# Patient Record
Sex: Female | Born: 1975 | Race: White | Hispanic: No | Marital: Married | Smoking: Former smoker
Health system: Southern US, Community
[De-identification: ages and names within clinical notes are randomized; demographics above are authoritative.]

## PROBLEM LIST (undated history)

## (undated) DIAGNOSIS — I10 Essential (primary) hypertension: Secondary | ICD-10-CM

## (undated) DIAGNOSIS — D37030 Neoplasm of uncertain behavior of the parotid salivary glands: Secondary | ICD-10-CM

---

## 2006-02-23 ENCOUNTER — Emergency Department: Payer: Self-pay | Admitting: Emergency Medicine

## 2007-01-15 ENCOUNTER — Emergency Department: Payer: Self-pay | Admitting: Emergency Medicine

## 2008-11-09 ENCOUNTER — Emergency Department: Payer: Self-pay

## 2011-01-18 ENCOUNTER — Ambulatory Visit: Payer: Self-pay | Admitting: Internal Medicine

## 2014-02-12 DIAGNOSIS — M549 Dorsalgia, unspecified: Secondary | ICD-10-CM | POA: Insufficient documentation

## 2014-02-12 DIAGNOSIS — M5416 Radiculopathy, lumbar region: Secondary | ICD-10-CM | POA: Insufficient documentation

## 2014-02-12 DIAGNOSIS — M545 Low back pain, unspecified: Secondary | ICD-10-CM | POA: Insufficient documentation

## 2014-02-12 HISTORY — DX: Radiculopathy, lumbar region: M54.16

## 2015-06-22 ENCOUNTER — Other Ambulatory Visit: Payer: Self-pay | Admitting: Internal Medicine

## 2015-06-22 DIAGNOSIS — R109 Unspecified abdominal pain: Secondary | ICD-10-CM

## 2015-06-24 ENCOUNTER — Ambulatory Visit
Admission: RE | Admit: 2015-06-24 | Discharge: 2015-06-24 | Disposition: A | Discharge status: Home / Self Care | Payer: BLUE CROSS/BLUE SHIELD | Source: Ambulatory Visit | Attending: Internal Medicine | Admitting: Internal Medicine

## 2015-06-24 DIAGNOSIS — K760 Fatty (change of) liver, not elsewhere classified: Secondary | ICD-10-CM | POA: Insufficient documentation

## 2015-06-24 DIAGNOSIS — R109 Unspecified abdominal pain: Secondary | ICD-10-CM | POA: Diagnosis not present

## 2016-01-17 ENCOUNTER — Other Ambulatory Visit: Payer: Self-pay | Admitting: Internal Medicine

## 2016-01-17 DIAGNOSIS — Z1231 Encounter for screening mammogram for malignant neoplasm of breast: Secondary | ICD-10-CM

## 2016-01-26 ENCOUNTER — Ambulatory Visit: Payer: BLUE CROSS/BLUE SHIELD

## 2016-02-23 ENCOUNTER — Other Ambulatory Visit: Payer: Self-pay | Admitting: Internal Medicine

## 2016-02-23 ENCOUNTER — Ambulatory Visit
Admission: RE | Admit: 2016-02-23 | Discharge: 2016-02-23 | Disposition: A | Discharge status: Home / Self Care | Payer: BLUE CROSS/BLUE SHIELD | Source: Ambulatory Visit | Attending: Internal Medicine | Admitting: Internal Medicine

## 2016-02-23 DIAGNOSIS — Z1231 Encounter for screening mammogram for malignant neoplasm of breast: Secondary | ICD-10-CM | POA: Diagnosis present

## 2016-02-24 ENCOUNTER — Other Ambulatory Visit: Payer: Self-pay | Admitting: Internal Medicine

## 2016-02-24 DIAGNOSIS — R928 Other abnormal and inconclusive findings on diagnostic imaging of breast: Secondary | ICD-10-CM

## 2016-03-10 ENCOUNTER — Ambulatory Visit
Admission: RE | Admit: 2016-03-10 | Discharge: 2016-03-10 | Disposition: A | Discharge status: Home / Self Care | Payer: BLUE CROSS/BLUE SHIELD | Source: Ambulatory Visit | Attending: Internal Medicine | Admitting: Internal Medicine

## 2016-03-10 DIAGNOSIS — R928 Other abnormal and inconclusive findings on diagnostic imaging of breast: Secondary | ICD-10-CM | POA: Diagnosis present

## 2017-01-16 ENCOUNTER — Other Ambulatory Visit: Payer: Self-pay | Admitting: Internal Medicine

## 2017-01-16 DIAGNOSIS — Z1231 Encounter for screening mammogram for malignant neoplasm of breast: Secondary | ICD-10-CM

## 2017-02-26 ENCOUNTER — Ambulatory Visit
Admission: RE | Admit: 2017-02-26 | Discharge: 2017-02-26 | Disposition: A | Discharge status: Home / Self Care | Payer: BLUE CROSS/BLUE SHIELD | Source: Ambulatory Visit | Attending: Internal Medicine | Admitting: Internal Medicine

## 2017-02-26 DIAGNOSIS — Z1231 Encounter for screening mammogram for malignant neoplasm of breast: Secondary | ICD-10-CM | POA: Insufficient documentation

## 2018-01-17 ENCOUNTER — Other Ambulatory Visit: Payer: Self-pay | Admitting: Internal Medicine

## 2018-01-17 DIAGNOSIS — Z1231 Encounter for screening mammogram for malignant neoplasm of breast: Secondary | ICD-10-CM

## 2018-02-27 ENCOUNTER — Ambulatory Visit
Admission: RE | Admit: 2018-02-27 | Discharge: 2018-02-27 | Disposition: A | Discharge status: Home / Self Care | Payer: BLUE CROSS/BLUE SHIELD | Source: Ambulatory Visit | Attending: Internal Medicine | Admitting: Internal Medicine

## 2018-02-27 DIAGNOSIS — Z1231 Encounter for screening mammogram for malignant neoplasm of breast: Secondary | ICD-10-CM | POA: Diagnosis not present

## 2019-05-02 ENCOUNTER — Other Ambulatory Visit: Payer: Self-pay | Admitting: Family

## 2019-05-02 ENCOUNTER — Other Ambulatory Visit: Payer: Self-pay | Admitting: Internal Medicine

## 2019-05-02 DIAGNOSIS — Z1231 Encounter for screening mammogram for malignant neoplasm of breast: Secondary | ICD-10-CM

## 2019-05-14 ENCOUNTER — Ambulatory Visit
Admission: RE | Admit: 2019-05-14 | Discharge: 2019-05-14 | Disposition: A | Discharge status: Home / Self Care | Payer: BC Managed Care – PPO | Source: Ambulatory Visit | Attending: Family | Admitting: Family

## 2019-05-14 DIAGNOSIS — Z1231 Encounter for screening mammogram for malignant neoplasm of breast: Secondary | ICD-10-CM | POA: Insufficient documentation

## 2019-06-26 DIAGNOSIS — E1165 Type 2 diabetes mellitus with hyperglycemia: Secondary | ICD-10-CM | POA: Insufficient documentation

## 2019-06-26 DIAGNOSIS — E119 Type 2 diabetes mellitus without complications: Secondary | ICD-10-CM

## 2019-06-26 DIAGNOSIS — E039 Hypothyroidism, unspecified: Secondary | ICD-10-CM

## 2019-06-26 HISTORY — DX: Type 2 diabetes mellitus without complications: E11.9

## 2019-06-26 HISTORY — DX: Hypothyroidism, unspecified: E03.9

## 2020-02-18 DIAGNOSIS — R5382 Chronic fatigue, unspecified: Secondary | ICD-10-CM

## 2020-02-18 DIAGNOSIS — R768 Other specified abnormal immunological findings in serum: Secondary | ICD-10-CM | POA: Insufficient documentation

## 2020-02-18 DIAGNOSIS — R6889 Other general symptoms and signs: Secondary | ICD-10-CM | POA: Insufficient documentation

## 2020-02-18 DIAGNOSIS — M797 Fibromyalgia: Secondary | ICD-10-CM | POA: Insufficient documentation

## 2020-02-18 DIAGNOSIS — Z79891 Long term (current) use of opiate analgesic: Secondary | ICD-10-CM | POA: Insufficient documentation

## 2020-02-18 HISTORY — DX: Chronic fatigue, unspecified: R53.82

## 2020-02-18 HISTORY — DX: Fibromyalgia: M79.7

## 2020-04-28 ENCOUNTER — Other Ambulatory Visit: Payer: Self-pay | Admitting: Family

## 2020-04-28 DIAGNOSIS — Z1231 Encounter for screening mammogram for malignant neoplasm of breast: Secondary | ICD-10-CM

## 2020-05-17 ENCOUNTER — Other Ambulatory Visit: Payer: Self-pay

## 2020-05-17 ENCOUNTER — Ambulatory Visit
Admission: RE | Admit: 2020-05-17 | Discharge: 2020-05-17 | Disposition: A | Discharge status: Home / Self Care | Payer: BC Managed Care – PPO | Source: Ambulatory Visit | Attending: Family | Admitting: Family

## 2020-05-17 DIAGNOSIS — Z1231 Encounter for screening mammogram for malignant neoplasm of breast: Secondary | ICD-10-CM

## 2020-07-22 ENCOUNTER — Other Ambulatory Visit: Payer: Self-pay | Admitting: Neurology

## 2020-07-22 DIAGNOSIS — R4189 Other symptoms and signs involving cognitive functions and awareness: Secondary | ICD-10-CM

## 2020-07-23 ENCOUNTER — Other Ambulatory Visit (HOSPITAL_COMMUNITY): Payer: Self-pay | Admitting: Neurology

## 2020-07-23 DIAGNOSIS — R4189 Other symptoms and signs involving cognitive functions and awareness: Secondary | ICD-10-CM

## 2020-08-02 ENCOUNTER — Other Ambulatory Visit: Payer: Self-pay

## 2020-08-02 ENCOUNTER — Ambulatory Visit (HOSPITAL_COMMUNITY)
Admission: RE | Admit: 2020-08-02 | Discharge: 2020-08-02 | Disposition: A | Discharge status: Home / Self Care | Payer: BC Managed Care – PPO | Source: Ambulatory Visit | Attending: Neurology | Admitting: Neurology

## 2020-08-02 DIAGNOSIS — R4189 Other symptoms and signs involving cognitive functions and awareness: Secondary | ICD-10-CM | POA: Insufficient documentation

## 2020-08-02 MED ORDER — GADOBUTROL 1 MMOL/ML IV SOLN
9.5000 mL | Freq: Once | INTRAVENOUS | Status: AC | PRN
Start: 2020-08-02 — End: 2020-08-02
  Administered 2020-08-02: 9.5 mL via INTRAVENOUS

## 2021-04-26 ENCOUNTER — Other Ambulatory Visit: Payer: Self-pay | Admitting: Family

## 2021-04-26 DIAGNOSIS — Z1231 Encounter for screening mammogram for malignant neoplasm of breast: Secondary | ICD-10-CM

## 2021-05-18 ENCOUNTER — Ambulatory Visit
Admission: RE | Admit: 2021-05-18 | Discharge: 2021-05-18 | Disposition: A | Discharge status: Home / Self Care | Payer: BC Managed Care – PPO | Source: Ambulatory Visit | Attending: Family | Admitting: Family

## 2021-05-18 ENCOUNTER — Other Ambulatory Visit: Payer: Self-pay

## 2021-05-18 DIAGNOSIS — Z1231 Encounter for screening mammogram for malignant neoplasm of breast: Secondary | ICD-10-CM | POA: Insufficient documentation

## 2022-04-11 ENCOUNTER — Ambulatory Visit
Admission: RE | Admit: 2022-04-11 | Discharge: 2022-04-11 | Disposition: A | Discharge status: Home / Self Care | Payer: Self-pay | Source: Ambulatory Visit | Attending: Family | Admitting: Family

## 2022-04-11 ENCOUNTER — Ambulatory Visit
Admission: RE | Admit: 2022-04-11 | Discharge: 2022-04-11 | Disposition: A | Discharge status: Home / Self Care | Payer: Self-pay | Attending: Family | Admitting: Family

## 2022-04-11 ENCOUNTER — Other Ambulatory Visit: Payer: Self-pay | Admitting: Family

## 2022-04-11 ENCOUNTER — Other Ambulatory Visit: Payer: Self-pay | Admitting: Nurse Practitioner

## 2022-04-11 DIAGNOSIS — W228XXA Striking against or struck by other objects, initial encounter: Secondary | ICD-10-CM | POA: Insufficient documentation

## 2022-04-11 DIAGNOSIS — R52 Pain, unspecified: Secondary | ICD-10-CM

## 2022-04-11 DIAGNOSIS — M1811 Unilateral primary osteoarthritis of first carpometacarpal joint, right hand: Secondary | ICD-10-CM | POA: Insufficient documentation

## 2022-04-20 ENCOUNTER — Other Ambulatory Visit: Payer: Self-pay | Admitting: Family

## 2022-05-10 ENCOUNTER — Other Ambulatory Visit: Payer: Self-pay | Admitting: Family

## 2022-05-10 DIAGNOSIS — N6310 Unspecified lump in the right breast, unspecified quadrant: Secondary | ICD-10-CM

## 2022-05-26 ENCOUNTER — Ambulatory Visit
Admission: RE | Admit: 2022-05-26 | Discharge: 2022-05-26 | Disposition: A | Discharge status: Home / Self Care | Payer: 59 | Source: Ambulatory Visit | Attending: Family | Admitting: Family

## 2022-05-26 DIAGNOSIS — N6310 Unspecified lump in the right breast, unspecified quadrant: Secondary | ICD-10-CM | POA: Diagnosis present

## 2022-06-20 DIAGNOSIS — E119 Type 2 diabetes mellitus without complications: Secondary | ICD-10-CM | POA: Diagnosis not present

## 2022-06-20 DIAGNOSIS — E039 Hypothyroidism, unspecified: Secondary | ICD-10-CM | POA: Diagnosis not present

## 2022-06-20 DIAGNOSIS — E785 Hyperlipidemia, unspecified: Secondary | ICD-10-CM | POA: Diagnosis not present

## 2022-06-20 DIAGNOSIS — E1169 Type 2 diabetes mellitus with other specified complication: Secondary | ICD-10-CM | POA: Diagnosis not present

## 2022-08-02 DIAGNOSIS — E611 Iron deficiency: Secondary | ICD-10-CM | POA: Diagnosis not present

## 2022-08-02 DIAGNOSIS — E782 Mixed hyperlipidemia: Secondary | ICD-10-CM | POA: Diagnosis not present

## 2022-08-02 DIAGNOSIS — E039 Hypothyroidism, unspecified: Secondary | ICD-10-CM | POA: Diagnosis not present

## 2022-08-02 DIAGNOSIS — J449 Chronic obstructive pulmonary disease, unspecified: Secondary | ICD-10-CM | POA: Diagnosis not present

## 2022-08-02 DIAGNOSIS — D519 Vitamin B12 deficiency anemia, unspecified: Secondary | ICD-10-CM | POA: Diagnosis not present

## 2022-08-02 DIAGNOSIS — I10 Essential (primary) hypertension: Secondary | ICD-10-CM | POA: Diagnosis not present

## 2022-08-02 DIAGNOSIS — E559 Vitamin D deficiency, unspecified: Secondary | ICD-10-CM | POA: Diagnosis not present

## 2022-08-02 DIAGNOSIS — E119 Type 2 diabetes mellitus without complications: Secondary | ICD-10-CM | POA: Diagnosis not present

## 2022-08-02 DIAGNOSIS — R5383 Other fatigue: Secondary | ICD-10-CM | POA: Diagnosis not present

## 2022-08-02 DIAGNOSIS — M79673 Pain in unspecified foot: Secondary | ICD-10-CM | POA: Diagnosis not present

## 2022-08-02 DIAGNOSIS — M722 Plantar fascial fibromatosis: Secondary | ICD-10-CM | POA: Diagnosis not present

## 2022-08-02 DIAGNOSIS — M797 Fibromyalgia: Secondary | ICD-10-CM | POA: Diagnosis not present

## 2022-08-02 DIAGNOSIS — L723 Sebaceous cyst: Secondary | ICD-10-CM | POA: Diagnosis not present

## 2022-08-17 ENCOUNTER — Ambulatory Visit: Payer: 59

## 2022-08-17 ENCOUNTER — Ambulatory Visit: Payer: 59 | Admitting: Podiatry

## 2022-08-17 DIAGNOSIS — M722 Plantar fascial fibromatosis: Secondary | ICD-10-CM

## 2022-08-22 ENCOUNTER — Encounter: Payer: Self-pay | Admitting: Podiatry

## 2022-08-22 NOTE — Progress Notes (Signed)
Subjective:  Patient ID: Kiara Woodard, female    DOB: [birth year],  MRN: [id number]  Chief Complaint  Patient presents with   Plantar Fasciitis    Bilateral heel pain right foot is worse than left     46 y.o. female presents with the above complaint.  Patient presents with bilateral heel pain right is worse than left side.  Hurts with ambulation worse with pressure.  She states that has been going for quite some time.  Pain scale is 7 out of 10 sharp shooting in nature hurts with taking for step in the morning.  Denies seeing anyone else prior to seeing me for this.   Review of Systems: Negative except as noted in the HPI. Denies N/V/F/Ch.  No past medical history on file.  Current Outpatient Medications:    cephALEXin (KEFLEX) 500 MG capsule, Take 500 mg by mouth 2 (two) times daily., Disp: , Rfl:    DULoxetine (CYMBALTA) 60 MG capsule, Take 120 mg by mouth daily., Disp: , Rfl:    meloxicam (MOBIC) 15 MG tablet, meloxicam 15 mg tablet, Disp: , Rfl:    modafinil (PROVIGIL) 100 MG tablet, Take by mouth., Disp: , Rfl:    Semaglutide, 1 MG/DOSE, (OZEMPIC, 1 MG/DOSE,) 4 MG/3ML SOPN, Inject into the skin., Disp: , Rfl:    traMADol (ULTRAM) 50 MG tablet, , Disp: , Rfl:    levothyroxine (SYNTHROID) 112 MCG tablet, , Disp: , Rfl:    liothyronine (CYTOMEL) 5 MCG tablet, Take 5 mcg by mouth 2 (two) times daily., Disp: , Rfl:    lisinopril (ZESTRIL) 2.5 MG tablet, lisinopril 2.5 mg tablet, Disp: , Rfl:    Losartan Potassium (COZAAR PO), , Disp: , Rfl:    metFORMIN (GLUCOPHAGE) 500 MG tablet, metformin 500 mg tablet, Disp: , Rfl:    methocarbamol (ROBAXIN) 750 MG tablet, methocarbamol 750 mg tablet, Disp: , Rfl:    polyethylene glycol-electrolytes (NULYTELY) 420 g solution, peg-electrolyte solution 420 gram oral solution, Disp: , Rfl:    predniSONE (DELTASONE) 10 MG tablet, , Disp: , Rfl:    pregabalin (LYRICA) 100 MG capsule, , Disp: , Rfl:    sertraline (ZOLOFT) 50 MG tablet, sertraline 50  mg tablet, Disp: , Rfl:    Sertraline HCl (ZOLOFT PO), Zoloft, Disp: , Rfl:    sulfamethoxazole-trimethoprim (BACTRIM DS) 800-160 MG tablet, sulfamethoxazole 800 mg-trimethoprim 160 mg tablet, Disp: , Rfl:    TRULICITY 3 MG/0.5ML SOPN, Inject into the skin., Disp: , Rfl:   Social History   Tobacco Use  Smoking Status Not on file  Smokeless Tobacco Not on file    Allergies  Allergen Reactions   Penicillin G Itching    Fever, aches, itching   Objective:  There were no vitals filed for this visit. There is no height or weight on file to calculate BMI. Constitutional Well developed. Well nourished.  Vascular Dorsalis pedis pulses palpable bilaterally. Posterior tibial pulses palpable bilaterally. Capillary refill normal to all digits.  No cyanosis or clubbing noted. Pedal hair growth normal.  Neurologic Normal speech. Oriented to person, place, and time. Epicritic sensation to light touch grossly present bilaterally.  Dermatologic Nails well groomed and normal in appearance. No open wounds. No skin lesions.  Orthopedic: Normal joint ROM without pain or crepitus bilaterally. No visible deformities. Tender to palpation at the calcaneal tuber bilaterally. No pain with calcaneal squeeze bilaterally. Ankle ROM diminished range of motion bilaterally. Silfverskiold Test: positive bilaterally.   Radiographs: Taken and reviewed. No acute fractures or dislocations.  No evidence of stress fracture.  Plantar heel spur present. Posterior heel spur absent.   Assessment:   1. Plantar fasciitis of right foot   2. Plantar fasciitis of left foot    Plan:  Patient was evaluated and treated and all questions answered.  Plantar Fasciitis, bilaterally - XR reviewed as above.  - Educated on icing and stretching. Instructions given.  - Injection delivered to the plantar fascia as below. - DME: Plantar fascial brace dispensed to support the medial longitudinal arch of the foot and offload  pressure from the heel and prevent arch collapse during weightbearing - Pharmacologic management: None  Procedure: Injection Tendon/Ligament Location: Bilateral plantar fascia at the glabrous junction; medial approach. Skin Prep: alcohol Injectate: 0.5 cc 0.5% marcaine plain, 0.5 cc of 1% Lidocaine, 0.5 cc kenalog 10. Disposition: Patient tolerated procedure well. Injection site dressed with a band-aid.  No follow-ups on file.

## 2022-08-24 DIAGNOSIS — R591 Generalized enlarged lymph nodes: Secondary | ICD-10-CM | POA: Diagnosis not present

## 2022-08-24 DIAGNOSIS — L720 Epidermal cyst: Secondary | ICD-10-CM | POA: Diagnosis not present

## 2022-09-14 ENCOUNTER — Ambulatory Visit: Payer: 59 | Admitting: Podiatry

## 2022-09-14 DIAGNOSIS — M722 Plantar fascial fibromatosis: Secondary | ICD-10-CM

## 2022-09-14 NOTE — Progress Notes (Signed)
Subjective:  Patient ID: Kiara Woodard, female    DOB: 11/25/1975,  MRN: [id number]  Chief Complaint  Patient presents with   Plantar Fasciitis    Patient is here for bilateral plantar fasciitis.    46 y.o. female presents with the above complaint.  Patient presents with complaint of bilateral heel pain/Planter fasciitis.  She states doing a lot better the injection helped considerably.  She would like to discuss next treatment plan.  She has been wearing her bracing as well.   Review of Systems: Negative except as noted in the HPI. Denies N/V/F/Ch.  No past medical history on file.  Current Outpatient Medications:    cephALEXin (KEFLEX) 500 MG capsule, Take 500 mg by mouth 2 (two) times daily., Disp: , Rfl:    DULoxetine (CYMBALTA) 60 MG capsule, Take 120 mg by mouth daily., Disp: , Rfl:    levothyroxine (SYNTHROID) 112 MCG tablet, , Disp: , Rfl:    liothyronine (CYTOMEL) 5 MCG tablet, Take 5 mcg by mouth 2 (two) times daily., Disp: , Rfl:    Losartan Potassium (COZAAR PO), , Disp: , Rfl:    polyethylene glycol-electrolytes (NULYTELY) 420 g solution, peg-electrolyte solution 420 gram oral solution, Disp: , Rfl:    predniSONE (DELTASONE) 10 MG tablet, , Disp: , Rfl:    pregabalin (LYRICA) 100 MG capsule, , Disp: , Rfl:    sertraline (ZOLOFT) 50 MG tablet, sertraline 50 mg tablet, Disp: , Rfl:    traMADol (ULTRAM) 50 MG tablet, , Disp: , Rfl:    TRULICITY 3 MG/0.5ML SOPN, Inject into the skin., Disp: , Rfl:    lisinopril (ZESTRIL) 2.5 MG tablet, lisinopril 2.5 mg tablet, Disp: , Rfl:    meloxicam (MOBIC) 15 MG tablet, meloxicam 15 mg tablet, Disp: , Rfl:    metFORMIN (GLUCOPHAGE) 500 MG tablet, metformin 500 mg tablet, Disp: , Rfl:    methocarbamol (ROBAXIN) 750 MG tablet, methocarbamol 750 mg tablet, Disp: , Rfl:    modafinil (PROVIGIL) 100 MG tablet, Take by mouth., Disp: , Rfl:    Semaglutide, 1 MG/DOSE, (OZEMPIC, 1 MG/DOSE,) 4 MG/3ML SOPN, Inject into the skin., Disp: , Rfl:     Sertraline HCl (ZOLOFT PO), Zoloft, Disp: , Rfl:    sulfamethoxazole-trimethoprim (BACTRIM DS) 800-160 MG tablet, sulfamethoxazole 800 mg-trimethoprim 160 mg tablet, Disp: , Rfl:   Social History   Tobacco Use  Smoking Status Not on file  Smokeless Tobacco Not on file    Allergies  Allergen Reactions   Penicillin G Itching    Fever, aches, itching   Objective:  There were no vitals filed for this visit. There is no height or weight on file to calculate BMI. Constitutional Well developed. Well nourished.  Vascular Dorsalis pedis pulses palpable bilaterally. Posterior tibial pulses palpable bilaterally. Capillary refill normal to all digits.  No cyanosis or clubbing noted. Pedal hair growth normal.  Neurologic Normal speech. Oriented to person, place, and time. Epicritic sensation to light touch grossly present bilaterally.  Dermatologic Nails well groomed and normal in appearance. No open wounds. No skin lesions.  Orthopedic: Normal joint ROM without pain or crepitus bilaterally. No visible deformities. Tender to palpation at the calcaneal tuber bilaterally. No pain with calcaneal squeeze bilaterally. Ankle ROM diminished range of motion bilaterally. Silfverskiold Test: positive bilaterally.   Radiographs: Taken and reviewed. No acute fractures or dislocations. No evidence of stress fracture.  Plantar heel spur present. Posterior heel spur absent.   Assessment:   1. Plantar fasciitis of right foot  2. Plantar fasciitis of left foot     Plan:  Patient was evaluated and treated and all questions answered.  Plantar Fasciitis, bilaterally - XR reviewed as above.  - Educated on icing and stretching. Instructions given.  -Second injection delivered to the plantar fascia as below. - DME: Plantar fascial brace dispensed to support the medial longitudinal arch of the foot and offload pressure from the heel and prevent arch collapse during weightbearing - Pharmacologic  management: None   Procedure: Injection Tendon/Ligament Location: Bilateral plantar fascia at the glabrous junction; medial approach. Skin Prep: alcohol Injectate: 0.5 cc 0.5% marcaine plain, 0.5 cc of 1% Lidocaine, 0.5 cc kenalog 10. Disposition: Patient tolerated procedure well. Injection site dressed with a band-aid.  No follow-ups on file.

## 2022-10-17 ENCOUNTER — Ambulatory Visit (INDEPENDENT_AMBULATORY_CARE_PROVIDER_SITE_OTHER): Payer: 59 | Admitting: Podiatry

## 2022-10-17 DIAGNOSIS — Q667 Congenital pes cavus, unspecified foot: Secondary | ICD-10-CM | POA: Diagnosis not present

## 2022-10-17 DIAGNOSIS — M722 Plantar fascial fibromatosis: Secondary | ICD-10-CM

## 2022-10-17 NOTE — Progress Notes (Signed)
Subjective:  Patient ID: Kiara Woodard, female    DOB: 1975-10-08,  MRN: [id number]  Chief Complaint  Patient presents with   Plantar Fasciitis    Pt stated that she is doing so much better     47 y.o. female presents with the above complaint.  Patient presents with complaint of bilateral heel pain/Planter fasciitis.  She states doing a lot better the injection helped considerably.  She would like to discuss orthotics option shoe gear modification she denies any other acute complaints.  She has been wearing her braces   Review of Systems: Negative except as noted in the HPI. Denies N/V/F/Ch.  No past medical history on file.  Current Outpatient Medications:    cephALEXin (KEFLEX) 500 MG capsule, Take 500 mg by mouth 2 (two) times daily., Disp: , Rfl:    DULoxetine (CYMBALTA) 60 MG capsule, Take 120 mg by mouth daily., Disp: , Rfl:    levothyroxine (SYNTHROID) 112 MCG tablet, , Disp: , Rfl:    liothyronine (CYTOMEL) 5 MCG tablet, Take 5 mcg by mouth 2 (two) times daily., Disp: , Rfl:    lisinopril (ZESTRIL) 2.5 MG tablet, lisinopril 2.5 mg tablet, Disp: , Rfl:    Losartan Potassium (COZAAR PO), , Disp: , Rfl:    meloxicam (MOBIC) 15 MG tablet, meloxicam 15 mg tablet, Disp: , Rfl:    metFORMIN (GLUCOPHAGE) 500 MG tablet, metformin 500 mg tablet, Disp: , Rfl:    methocarbamol (ROBAXIN) 750 MG tablet, methocarbamol 750 mg tablet, Disp: , Rfl:    modafinil (PROVIGIL) 100 MG tablet, Take by mouth., Disp: , Rfl:    polyethylene glycol-electrolytes (NULYTELY) 420 g solution, peg-electrolyte solution 420 gram oral solution, Disp: , Rfl:    predniSONE (DELTASONE) 10 MG tablet, , Disp: , Rfl:    pregabalin (LYRICA) 100 MG capsule, , Disp: , Rfl:    Semaglutide, 1 MG/DOSE, (OZEMPIC, 1 MG/DOSE,) 4 MG/3ML SOPN, Inject into the skin., Disp: , Rfl:    sertraline (ZOLOFT) 50 MG tablet, sertraline 50 mg tablet, Disp: , Rfl:    Sertraline HCl (ZOLOFT PO), Zoloft, Disp: , Rfl:     sulfamethoxazole-trimethoprim (BACTRIM DS) 800-160 MG tablet, sulfamethoxazole 800 mg-trimethoprim 160 mg tablet, Disp: , Rfl:    traMADol (ULTRAM) 50 MG tablet, , Disp: , Rfl:    TRULICITY 3 ZJ/6.9CV SOPN, Inject into the skin., Disp: , Rfl:   Social History   Tobacco Use  Smoking Status Not on file  Smokeless Tobacco Not on file    Allergies  Allergen Reactions   Penicillin G Itching    Fever, aches, itching   Objective:  There were no vitals filed for this visit. There is no height or weight on file to calculate BMI. Constitutional Well developed. Well nourished.  Vascular Dorsalis pedis pulses palpable bilaterally. Posterior tibial pulses palpable bilaterally. Capillary refill normal to all digits.  No cyanosis or clubbing noted. Pedal hair growth normal.  Neurologic Normal speech. Oriented to person, place, and time. Epicritic sensation to light touch grossly present bilaterally.  Dermatologic Nails well groomed and normal in appearance. No open wounds. No skin lesions.  Orthopedic: Normal joint ROM without pain or crepitus bilaterally. No visible deformities. No further tender to palpation at the calcaneal tuber bilaterally. No pain with calcaneal squeeze bilaterally. Ankle ROM diminished range of motion bilaterally. Silfverskiold Test: positive bilaterally.   Radiographs: Taken and reviewed. No acute fractures or dislocations. No evidence of stress fracture.  Plantar heel spur present. Posterior heel spur absent.  Assessment:   1. Plantar fasciitis of right foot   2. Plantar fasciitis of left foot   3. Pes cavus      Plan:  Patient was evaluated and treated and all questions answered.  Plantar Fasciitis, bilaterally -Clinically healed and doing much better.  Patient states that the injection helped considerably.  At this time I discussed shoe gear modification and orthotics option.  She states understanding like to proceed with getting orthotics.  Pes  cavus -I explained to patient the etiology of pes cavus and relationship with Planter fasciitis and various treatment options were discussed.  Given patient foot structure in the setting of Planter fasciitis I believe patient will benefit from custom-made orthotics to help control the hindfoot motion support the arch of the foot and take the stress away from plantar fascial.  Patient agrees with the plan like to proceed with orthotics -Patient was casted for orthotics   No follow-ups on file.

## 2022-11-29 ENCOUNTER — Telehealth: Payer: Self-pay | Admitting: Podiatry

## 2022-11-29 NOTE — Telephone Encounter (Signed)
Lmom for pt to schedule appt to pick up orthotics they are currently in [place] , will ship to [place] when scheduled  Balance is [phone]

## 2022-12-04 ENCOUNTER — Other Ambulatory Visit: Payer: Self-pay | Admitting: Family

## 2022-12-08 ENCOUNTER — Ambulatory Visit: Payer: 59 | Admitting: Podiatry

## 2023-01-25 ENCOUNTER — Telehealth: Payer: Self-pay

## 2023-01-25 DIAGNOSIS — R590 Localized enlarged lymph nodes: Secondary | ICD-10-CM

## 2023-01-25 NOTE — Telephone Encounter (Signed)
Pt called and left vm asking for refill on rx pregabalin, wants rx to go to [facility].  She also mentioned a few months ago it was discussed about knot behind her ear, she went to dermatology & they said it wasn't a cyst it was a swollen lymph node and she never heard back about doing a CT or MRI on this, I didn't see an order or anything in the old system. Please advise

## 2023-01-26 ENCOUNTER — Other Ambulatory Visit: Payer: 59

## 2023-01-29 ENCOUNTER — Other Ambulatory Visit: Payer: Self-pay

## 2023-01-29 MED ORDER — PREGABALIN 150 MG PO CAPS: 150.0000 mg | ORAL_CAPSULE | Freq: Two times a day (BID) | ORAL | 2 refills | Status: DC

## 2023-01-29 NOTE — Addendum Note (Signed)
Addended by: Grayling Congress on: 01/29/2023 03:18 PM   Modules accepted: Orders

## 2023-02-07 ENCOUNTER — Other Ambulatory Visit: Payer: Self-pay | Admitting: Family

## 2023-02-07 ENCOUNTER — Ambulatory Visit: Payer: 59 | Admitting: Family

## 2023-02-12 ENCOUNTER — Ambulatory Visit (INDEPENDENT_AMBULATORY_CARE_PROVIDER_SITE_OTHER): Payer: 59 | Admitting: Family

## 2023-02-12 VITALS — BP 138/72 | HR 73 | Ht 69.0 in | Wt 217.4 lb

## 2023-02-12 DIAGNOSIS — R109 Unspecified abdominal pain: Secondary | ICD-10-CM | POA: Diagnosis not present

## 2023-02-12 DIAGNOSIS — I10 Essential (primary) hypertension: Secondary | ICD-10-CM

## 2023-02-12 DIAGNOSIS — E538 Deficiency of other specified B group vitamins: Secondary | ICD-10-CM | POA: Diagnosis not present

## 2023-02-12 DIAGNOSIS — E039 Hypothyroidism, unspecified: Secondary | ICD-10-CM

## 2023-02-12 DIAGNOSIS — R590 Localized enlarged lymph nodes: Secondary | ICD-10-CM | POA: Diagnosis not present

## 2023-02-12 DIAGNOSIS — E782 Mixed hyperlipidemia: Secondary | ICD-10-CM | POA: Diagnosis not present

## 2023-02-12 DIAGNOSIS — E559 Vitamin D deficiency, unspecified: Secondary | ICD-10-CM

## 2023-02-12 DIAGNOSIS — E1165 Type 2 diabetes mellitus with hyperglycemia: Secondary | ICD-10-CM | POA: Diagnosis not present

## 2023-02-12 DIAGNOSIS — R5383 Other fatigue: Secondary | ICD-10-CM

## 2023-02-12 DIAGNOSIS — Z205 Contact with and (suspected) exposure to viral hepatitis: Secondary | ICD-10-CM | POA: Diagnosis not present

## 2023-02-12 NOTE — Progress Notes (Signed)
Established Patient Office Visit  Subjective:  Patient ID: Kiara Woodard, female    DOB: 05/05/1976  Age: 47 y.o. MRN: [id number]  Chief Complaint  Patient presents with   Follow-up    Abdominal cramping    Is currently going through a divorce, there is a lot of stress around that. Having to sell her house.  Had a few days where she couldn't walk.   Has no energy, not working at the moment.   1.5 months  - started with diarrhea as soon as she would eat. "Egg burps" here and there. A whole week of constant diarrhea. Stopped after a week, felt bloated, burps became constant, much worse.   Went to get something, started throwing up.   No other concerns at this time.   History reviewed. No pertinent past medical history.  History reviewed. No pertinent surgical history.  Social History   Socioeconomic History   Marital status: Married    Spouse name: Not on file   Number of children: Not on file   Years of education: Not on file   Highest education level: Not on file  Occupational History   Not on file  Tobacco Use   Smoking status: Former    Types: Cigarettes   Smokeless tobacco: Never  Substance and Sexual Activity   Alcohol use: Not on file   Drug use: Not on file   Sexual activity: Not on file  Other Topics Concern   Not on file  Social History Narrative   Not on file   Social Determinants of Health   Financial Resource Strain: Not on file  Food Insecurity: Not on file  Transportation Needs: Not on file  Physical Activity: Not on file  Stress: Not on file  Social Connections: Not on file  Intimate Partner Violence: Not on file    Family History  Problem Relation Age of Onset   Breast cancer Maternal Aunt        mat great aunt    Allergies  Allergen Reactions   Penicillin G Itching    Fever, aches, itching    Review of Systems  Constitutional:  Positive for malaise/fatigue.  Gastrointestinal:  Positive for abdominal pain, diarrhea,  heartburn, nausea and vomiting.  Psychiatric/Behavioral:  Positive for depression. The patient is nervous/anxious and has insomnia.        Objective:   BP 138/72   Pulse 73   Ht 5\' 9"  (1.753 m)   Wt 217 lb 6.4 oz (98.6 kg)   SpO2 97%   BMI 32.10 kg/m   Vitals:   02/12/23 1432  BP: 138/72  Pulse: 73  Height: 5\' 9"  (1.753 m)  Weight: 217 lb 6.4 oz (98.6 kg)  SpO2: 97%  BMI (Calculated): 32.09    Physical Exam Vitals and nursing note reviewed.  Constitutional:      Appearance: Normal appearance. She is normal weight.  HENT:     Head: Normocephalic.  Eyes:     Extraocular Movements: Extraocular movements intact.     Pupils: Pupils are equal, round, and reactive to light.  Cardiovascular:     Rate and Rhythm: Normal rate.  Pulmonary:     Effort: Pulmonary effort is normal.  Lymphadenopathy:     Cervical: Cervical adenopathy present.  Neurological:     Mental Status: She is alert.      Results for orders placed or performed in visit on 02/12/23  H. pylori breath test  Result Value Ref Range   H  pylori Breath Test Negative Negative  Lipid panel  Result Value Ref Range   Cholesterol, Total 210 (H) 100 - 199 mg/dL   Triglycerides 102 (H) 0 - 149 mg/dL   HDL 29 (L) >72 mg/dL   VLDL Cholesterol Cal 43 (H) 5 - 40 mg/dL   LDL Chol Calc (NIH) 536 (H) 0 - 99 mg/dL   Chol/HDL Ratio 7.2 (H) 0.0 - 4.4 ratio  VITAMIN D 25 Hydroxy (Vit-D Deficiency, Fractures)  Result Value Ref Range   Vit D, 25-Hydroxy 32.6 30.0 - 100.0 ng/mL  CBC With Differential  Result Value Ref Range   WBC 7.2 3.4 - 10.8 x10E3/uL   RBC 4.67 3.77 - 5.28 x10E6/uL   Hemoglobin 13.5 11.1 - 15.9 g/dL   Hematocrit 64.4 03.4 - 46.6 %   MCV 88 79 - 97 fL   MCH 28.9 26.6 - 33.0 pg   MCHC 32.7 31.5 - 35.7 g/dL   RDW 74.2 59.5 - 63.8 %   Neutrophils 64 Not Estab. %   Lymphs 24 Not Estab. %   Monocytes 6 Not Estab. %   Eos 4 Not Estab. %   Basos 2 Not Estab. %   Neutrophils Absolute 4.6 1.4 - 7.0  x10E3/uL   Lymphocytes Absolute 1.8 0.7 - 3.1 x10E3/uL   Monocytes Absolute 0.4 0.1 - 0.9 x10E3/uL   EOS (ABSOLUTE) 0.3 0.0 - 0.4 x10E3/uL   Basophils Absolute 0.1 0.0 - 0.2 x10E3/uL   Immature Granulocytes 0 Not Estab. %   Immature Grans (Abs) 0.0 0.0 - 0.1 x10E3/uL  CMP14+EGFR  Result Value Ref Range   Glucose 154 (H) 70 - 99 mg/dL   BUN 10 6 - 24 mg/dL   Creatinine, Ser 7.56 0.57 - 1.00 mg/dL   eGFR 433 >29 JJ/OAC/1.66   BUN/Creatinine Ratio 14 9 - 23   Sodium 136 134 - 144 mmol/L   Potassium 4.2 3.5 - 5.2 mmol/L   Chloride 101 96 - 106 mmol/L   CO2 21 20 - 29 mmol/L   Calcium 8.6 (L) 8.7 - 10.2 mg/dL   Total Protein 6.6 6.0 - 8.5 g/dL   Albumin 4.0 3.9 - 4.9 g/dL   Globulin, Total 2.6 1.5 - 4.5 g/dL   Albumin/Globulin Ratio 1.5 1.2 - 2.2   Bilirubin Total 0.4 0.0 - 1.2 mg/dL   Alkaline Phosphatase 112 44 - 121 IU/L   AST 11 0 - 40 IU/L   ALT 11 0 - 32 IU/L  TSH  Result Value Ref Range   TSH 0.895 0.450 - 4.500 uIU/mL  Hemoglobin A1c  Result Value Ref Range   Hgb A1c MFr Bld 7.5 (H) 4.8 - 5.6 %   Est. average glucose Bld gHb Est-mCnc 169 mg/dL  Vitamin A63  Result Value Ref Range   Vitamin B-12 561 232 - 1,245 pg/mL  Hepatitis C Ab reflex to Quant PCR  Result Value Ref Range   HCV Ab Non Reactive Non Reactive  Iron, TIBC and Ferritin Panel  Result Value Ref Range   Total Iron Binding Capacity 304 250 - 450 ug/dL   UIBC 016 010 - 932 ug/dL   Iron 355 27 - 732 ug/dL   Iron Saturation 34 15 - 55 %   Ferritin 15 15 - 150 ng/mL  Interpretation:  Result Value Ref Range   HCV Interp 1: Comment     Recent Results (from the past 2160 hour(s))  Lipid panel     Status: Abnormal   Collection Time: 02/13/23 10:37  AM  Result Value Ref Range   Cholesterol, Total 210 (H) 100 - 199 mg/dL   Triglycerides 782 (H) 0 - 149 mg/dL   HDL 29 (L) >95 mg/dL   VLDL Cholesterol Cal 43 (H) 5 - 40 mg/dL   LDL Chol Calc (NIH) 621 (H) 0 - 99 mg/dL   Chol/HDL Ratio 7.2 (H) 0.0 - 4.4  ratio    Comment:                                   T. Chol/HDL Ratio                                             Men  Women                               1/2 Avg.Risk  3.4    3.3                                   Avg.Risk  5.0    4.4                                2X Avg.Risk  9.6    7.1                                3X Avg.Risk 23.4   11.0   VITAMIN D 25 Hydroxy (Vit-D Deficiency, Fractures)     Status: None   Collection Time: 02/13/23 10:37 AM  Result Value Ref Range   Vit D, 25-Hydroxy 32.6 30.0 - 100.0 ng/mL    Comment: Vitamin D deficiency has been defined by the Institute of Medicine and an Endocrine Society practice guideline as a level of serum 25-OH vitamin D less than 20 ng/mL (1,2). The Endocrine Society went on to further define vitamin D insufficiency as a level between 21 and 29 ng/mL (2). 1. IOM (Institute of Medicine). 2010. Dietary reference    intakes for calcium and D. [place]: The    [employer]. 2. Holick MF, Binkley [place], Bischoff-Ferrari HA, et al.    Evaluation, treatment, and prevention of vitamin D    deficiency: an Endocrine Society clinical practice    guideline. JCEM. 2011 Jul; 96(7):1911-30.   CBC With Differential     Status: None   Collection Time: 02/13/23 10:37 AM  Result Value Ref Range   WBC 7.2 3.4 - 10.8 x10E3/uL   RBC 4.67 3.77 - 5.28 x10E6/uL   Hemoglobin 13.5 11.1 - 15.9 g/dL   Hematocrit 30.8 65.7 - 46.6 %   MCV 88 79 - 97 fL   MCH 28.9 26.6 - 33.0 pg   MCHC 32.7 31.5 - 35.7 g/dL   RDW 84.6 96.2 - 95.2 %   Neutrophils 64 Not Estab. %   Lymphs 24 Not Estab. %   Monocytes 6 Not Estab. %   Eos 4 Not Estab. %   Basos 2 Not Estab. %   Neutrophils Absolute 4.6 1.4 - 7.0 x10E3/uL   Lymphocytes Absolute 1.8 0.7 - 3.1  x10E3/uL   Monocytes Absolute 0.4 0.1 - 0.9 x10E3/uL   EOS (ABSOLUTE) 0.3 0.0 - 0.4 x10E3/uL   Basophils Absolute 0.1 0.0 - 0.2 x10E3/uL   Immature Granulocytes 0 Not Estab. %   Immature Grans (Abs) 0.0  0.0 - 0.1 x10E3/uL  CMP14+EGFR     Status: Abnormal   Collection Time: 02/13/23 10:37 AM  Result Value Ref Range   Glucose 154 (H) 70 - 99 mg/dL   BUN 10 6 - 24 mg/dL   Creatinine, Ser 2.13 0.57 - 1.00 mg/dL   eGFR 086 >57 QI/ONG/2.95   BUN/Creatinine Ratio 14 9 - 23   Sodium 136 134 - 144 mmol/L   Potassium 4.2 3.5 - 5.2 mmol/L   Chloride 101 96 - 106 mmol/L   CO2 21 20 - 29 mmol/L   Calcium 8.6 (L) 8.7 - 10.2 mg/dL   Total Protein 6.6 6.0 - 8.5 g/dL   Albumin 4.0 3.9 - 4.9 g/dL   Globulin, Total 2.6 1.5 - 4.5 g/dL   Albumin/Globulin Ratio 1.5 1.2 - 2.2   Bilirubin Total 0.4 0.0 - 1.2 mg/dL   Alkaline Phosphatase 112 44 - 121 IU/L   AST 11 0 - 40 IU/L   ALT 11 0 - 32 IU/L  TSH     Status: None   Collection Time: 02/13/23 10:37 AM  Result Value Ref Range   TSH 0.895 0.450 - 4.500 uIU/mL  Hemoglobin A1c     Status: Abnormal   Collection Time: 02/13/23 10:37 AM  Result Value Ref Range   Hgb A1c MFr Bld 7.5 (H) 4.8 - 5.6 %    Comment:          Prediabetes: 5.7 - 6.4          Diabetes: >6.4          Glycemic control for adults with diabetes: <7.0    Est. average glucose Bld gHb Est-mCnc 169 mg/dL  Vitamin M84     Status: None   Collection Time: 02/13/23 10:37 AM  Result Value Ref Range   Vitamin B-12 561 232 - 1,245 pg/mL  Hepatitis C Ab reflex to Quant PCR     Status: None   Collection Time: 02/13/23 10:37 AM  Result Value Ref Range   HCV Ab Non Reactive Non Reactive  Iron, TIBC and Ferritin Panel     Status: None   Collection Time: 02/13/23 10:37 AM  Result Value Ref Range   Total Iron Binding Capacity 304 250 - 450 ug/dL   UIBC 132 440 - 102 ug/dL   Iron 725 27 - 366 ug/dL   Iron Saturation 34 15 - 55 %   Ferritin 15 15 - 150 ng/mL  Interpretation:     Status: None   Collection Time: 02/13/23 10:37 AM  Result Value Ref Range   HCV Interp 1: Comment     Comment: Not infected with HCV unless early or acute infection is suspected (which may be delayed in an  immunocompromised individual), or other evidence exists to indicate HCV infection.   H. pylori breath test     Status: None   Collection Time: 02/13/23  1:14 PM  Result Value Ref Range   H pylori Breath Test Negative Negative       Assessment & Plan:   Problem List Items Addressed This Visit       Active Problems   Hypothyroidism (acquired)   Relevant Orders   CBC With Differential (Completed)   CMP14+EGFR (Completed)  Other Visit Diagnoses     Cervical lymphadenopathy    -  Primary   Relevant Orders   CBC With Differential (Completed)   CMP14+EGFR (Completed)   US Soft Tissue Head/Neck (NON-THYROID) (Completed)   Interpretation: (Completed)   Exposure to hepatitis C       Relevant Orders   CBC With Differential (Completed)   CMP14+EGFR (Completed)   Hepatitis C Ab reflex to Quant PCR (Completed)   Type 2 diabetes mellitus with hyperglycemia, without long-term current use of insulin (HCC)       Relevant Orders   CBC With Differential (Completed)   CMP14+EGFR (Completed)   Hemoglobin A1c (Completed)   Mixed hyperlipidemia       Relevant Orders   Lipid panel (Completed)   CBC With Differential (Completed)   CMP14+EGFR (Completed)   Essential hypertension, benign       Relevant Orders   CBC With Differential (Completed)   CMP14+EGFR (Completed)   B12 deficiency due to diet       Relevant Orders   CBC With Differential (Completed)   CMP14+EGFR (Completed)   Vitamin B12 (Completed)   Other fatigue       Relevant Orders   CBC With Differential (Completed)   CMP14+EGFR (Completed)   TSH (Completed)   Iron, TIBC and Ferritin Panel (Completed)   Vitamin D deficiency, unspecified       Relevant Orders   VITAMIN D 25 Hydroxy (Vit-D Deficiency, Fractures) (Completed)   CBC With Differential (Completed)   CMP14+EGFR (Completed)   Stomach pain       Relevant Orders   H. pylori breath test (Completed)   CBC With Differential (Completed)   CMP14+EGFR (Completed)        Return in about 1 week (around 02/19/2023).   Total time spent: 20 minutes  Miki Kins, FNP  02/12/2023   This document may have been prepared by [facility] Voice Recognition software and as such may include unintentional dictation errors.

## 2023-02-13 ENCOUNTER — Other Ambulatory Visit: Payer: 59

## 2023-02-13 DIAGNOSIS — I10 Essential (primary) hypertension: Secondary | ICD-10-CM | POA: Diagnosis not present

## 2023-02-13 DIAGNOSIS — E039 Hypothyroidism, unspecified: Secondary | ICD-10-CM | POA: Diagnosis not present

## 2023-02-13 DIAGNOSIS — E538 Deficiency of other specified B group vitamins: Secondary | ICD-10-CM | POA: Diagnosis not present

## 2023-02-13 DIAGNOSIS — E559 Vitamin D deficiency, unspecified: Secondary | ICD-10-CM | POA: Diagnosis not present

## 2023-02-13 DIAGNOSIS — E1165 Type 2 diabetes mellitus with hyperglycemia: Secondary | ICD-10-CM | POA: Diagnosis not present

## 2023-02-13 DIAGNOSIS — Z205 Contact with and (suspected) exposure to viral hepatitis: Secondary | ICD-10-CM | POA: Diagnosis not present

## 2023-02-13 DIAGNOSIS — R5383 Other fatigue: Secondary | ICD-10-CM | POA: Diagnosis not present

## 2023-02-13 DIAGNOSIS — R590 Localized enlarged lymph nodes: Secondary | ICD-10-CM | POA: Diagnosis not present

## 2023-02-13 DIAGNOSIS — E782 Mixed hyperlipidemia: Secondary | ICD-10-CM | POA: Diagnosis not present

## 2023-02-13 DIAGNOSIS — R109 Unspecified abdominal pain: Secondary | ICD-10-CM | POA: Diagnosis not present

## 2023-02-14 LAB — CBC WITH DIFFERENTIAL
Basophils Absolute: 0.1 10*3/uL (ref 0.0–0.2)
Basos: 2 %
EOS (ABSOLUTE): 0.3 10*3/uL (ref 0.0–0.4)
Eos: 4 %
Hematocrit: 41.3 % (ref 34.0–46.6)
Hemoglobin: 13.5 g/dL (ref 11.1–15.9)
Immature Grans (Abs): 0 10*3/uL (ref 0.0–0.1)
Immature Granulocytes: 0 %
Lymphocytes Absolute: 1.8 10*3/uL (ref 0.7–3.1)
Lymphs: 24 %
MCH: 28.9 pg (ref 26.6–33.0)
MCHC: 32.7 g/dL (ref 31.5–35.7)
MCV: 88 fL (ref 79–97)
Monocytes Absolute: 0.4 10*3/uL (ref 0.1–0.9)
Monocytes: 6 %
Neutrophils Absolute: 4.6 10*3/uL (ref 1.4–7.0)
Neutrophils: 64 %
RBC: 4.67 x10E6/uL (ref 3.77–5.28)
RDW: 13.5 % (ref 11.7–15.4)
WBC: 7.2 10*3/uL (ref 3.4–10.8)

## 2023-02-14 LAB — CMP14+EGFR
ALT: 11 IU/L (ref 0–32)
AST: 11 IU/L (ref 0–40)
Albumin/Globulin Ratio: 1.5 (ref 1.2–2.2)
Albumin: 4 g/dL (ref 3.9–4.9)
Alkaline Phosphatase: 112 IU/L (ref 44–121)
BUN/Creatinine Ratio: 14 (ref 9–23)
BUN: 10 mg/dL (ref 6–24)
Bilirubin Total: 0.4 mg/dL (ref 0.0–1.2)
CO2: 21 mmol/L (ref 20–29)
Calcium: 8.6 mg/dL — ABNORMAL LOW (ref 8.7–10.2)
Chloride: 101 mmol/L (ref 96–106)
Creatinine, Ser: 0.71 mg/dL (ref 0.57–1.00)
Globulin, Total: 2.6 g/dL (ref 1.5–4.5)
Glucose: 154 mg/dL — ABNORMAL HIGH (ref 70–99)
Potassium: 4.2 mmol/L (ref 3.5–5.2)
Sodium: 136 mmol/L (ref 134–144)
Total Protein: 6.6 g/dL (ref 6.0–8.5)
eGFR: 105 mL/min/{1.73_m2} (ref >59)

## 2023-02-14 LAB — HEMOGLOBIN A1C
Est. average glucose Bld gHb Est-mCnc: 169 mg/dL
Hgb A1c MFr Bld: 7.5 % — ABNORMAL HIGH (ref 4.8–5.6)

## 2023-02-14 LAB — HCV AB W REFLEX TO QUANT PCR: HCV Ab: NONREACTIVE

## 2023-02-14 LAB — LIPID PANEL
Chol/HDL Ratio: 7.2 ratio — ABNORMAL HIGH (ref 0.0–4.4)
Cholesterol, Total: 210 mg/dL — ABNORMAL HIGH (ref 100–199)
HDL: 29 mg/dL — ABNORMAL LOW (ref >39)
LDL Chol Calc (NIH): 138 mg/dL — ABNORMAL HIGH (ref 0–99)
Triglycerides: 237 mg/dL — ABNORMAL HIGH (ref 0–149)
VLDL Cholesterol Cal: 43 mg/dL — ABNORMAL HIGH (ref 5–40)

## 2023-02-14 LAB — IRON,TIBC AND FERRITIN PANEL
Ferritin: 15 ng/mL (ref 15–150)
Iron Saturation: 34 % (ref 15–55)
Iron: 102 ug/dL (ref 27–159)
Total Iron Binding Capacity: 304 ug/dL (ref 250–450)
UIBC: 202 ug/dL (ref 131–425)

## 2023-02-14 LAB — TSH: TSH: 0.895 u[IU]/mL (ref 0.450–4.500)

## 2023-02-14 LAB — VITAMIN D 25 HYDROXY (VIT D DEFICIENCY, FRACTURES): Vit D, 25-Hydroxy: 32.6 ng/mL (ref 30.0–100.0)

## 2023-02-14 LAB — H. PYLORI BREATH TEST: H pylori Breath Test: NEGATIVE

## 2023-02-14 LAB — VITAMIN B12: Vitamin B-12: 561 pg/mL (ref 232–1245)

## 2023-02-14 LAB — HCV INTERPRETATION

## 2023-02-19 ENCOUNTER — Ambulatory Visit
Admission: RE | Admit: 2023-02-19 | Discharge: 2023-02-19 | Disposition: A | Discharge status: Home / Self Care | Payer: 59 | Source: Ambulatory Visit | Attending: Family | Admitting: Family

## 2023-02-19 ENCOUNTER — Ambulatory Visit (INDEPENDENT_AMBULATORY_CARE_PROVIDER_SITE_OTHER): Payer: 59 | Admitting: Family

## 2023-02-19 VITALS — BP 128/68 | HR 100 | Ht 69.0 in | Wt 216.0 lb

## 2023-02-19 DIAGNOSIS — R590 Localized enlarged lymph nodes: Secondary | ICD-10-CM | POA: Diagnosis not present

## 2023-02-19 DIAGNOSIS — Z77120 Contact with and (suspected) exposure to mold (toxic): Secondary | ICD-10-CM | POA: Diagnosis not present

## 2023-02-19 DIAGNOSIS — R4189 Other symptoms and signs involving cognitive functions and awareness: Secondary | ICD-10-CM

## 2023-02-20 ENCOUNTER — Encounter: Payer: Self-pay | Admitting: Family

## 2023-02-21 ENCOUNTER — Other Ambulatory Visit: Payer: Self-pay

## 2023-02-28 ENCOUNTER — Encounter: Payer: Self-pay | Admitting: Family

## 2023-02-28 NOTE — Progress Notes (Signed)
Established Patient Office Visit  Subjective:  Patient ID: Kiara Woodard, female    DOB: October 18, 1975  Age: 47 y.o. MRN: [id number]  Chief Complaint  Patient presents with   Follow-up    1 week follow up    Patient is here for her 1 week follow up.  She has improved some over last week, but she does report that she has been having some issues with mold in her house that she was unaware of.  She has been selling her home and they found mold under the house.  She is concerned that this could have been causing her brain fog, joint pains, etc.   She asks if there is any way for [place] to find out.     No other concerns at this time.   History reviewed. No pertinent past medical history.  History reviewed. No pertinent surgical history.  Social History   Socioeconomic History   Marital status: Married    Spouse name: Not on file   Number of children: Not on file   Years of education: Not on file   Highest education level: Not on file  Occupational History   Not on file  Tobacco Use   Smoking status: Former    Types: Cigarettes   Smokeless tobacco: Never  Substance and Sexual Activity   Alcohol use: Not on file   Drug use: Not on file   Sexual activity: Not on file  Other Topics Concern   Not on file  Social History Narrative   Not on file   Social Determinants of Health   Financial Resource Strain: Not on file  Food Insecurity: Not on file  Transportation Needs: Not on file  Physical Activity: Not on file  Stress: Not on file  Social Connections: Not on file  Intimate Partner Violence: Not on file    Family History  Problem Relation Age of Onset   Breast cancer Maternal Aunt        mat great aunt    Allergies  Allergen Reactions   Penicillin G Itching    Fever, aches, itching    Review of Systems  Constitutional:  Positive for malaise/fatigue.  Musculoskeletal:  Positive for joint pain and myalgias.  Neurological:        Brain fog.   Psychiatric/Behavioral:  Positive for depression. The patient is nervous/anxious.   All other systems reviewed and are negative.      Objective:   BP 128/68   Pulse 100   Ht 5\' 9"  (1.753 m)   Wt 216 lb (98 kg)   SpO2 96%   BMI 31.90 kg/m   Vitals:   02/19/23 1446  BP: 128/68  Pulse: 100  Height: 5\' 9"  (1.753 m)  Weight: 216 lb (98 kg)  SpO2: 96%  BMI (Calculated): 31.88    Physical Exam Vitals and nursing note reviewed.  Constitutional:      General: She is not in acute distress.    Appearance: Normal appearance. She is ill-appearing. She is not toxic-appearing or diaphoretic.  HENT:     Head: Normocephalic.  Eyes:     Pupils: Pupils are equal, round, and reactive to light.  Cardiovascular:     Rate and Rhythm: Normal rate.  Pulmonary:     Effort: Pulmonary effort is normal.  Neurological:     General: No focal deficit present.     Mental Status: She is alert and oriented to person, place, and time. Mental status is at baseline.  Psychiatric:        Attention and Perception: Attention normal.        Mood and Affect: Mood is anxious and depressed. Affect is flat.        Speech: Speech normal.        Behavior: Behavior is slowed.        Thought Content: Thought content normal.        Cognition and Memory: Cognition is impaired. Memory is impaired.        Judgment: Judgment normal.      No results found for any visits on 02/19/23.  Recent Results (from the past 2160 hour(s))  Lipid panel     Status: Abnormal   Collection Time: 02/13/23 10:37 AM  Result Value Ref Range   Cholesterol, Total 210 (H) 100 - 199 mg/dL   Triglycerides 578 (H) 0 - 149 mg/dL   HDL 29 (L) >46 mg/dL   VLDL Cholesterol Cal 43 (H) 5 - 40 mg/dL   LDL Chol Calc (NIH) 962 (H) 0 - 99 mg/dL   Chol/HDL Ratio 7.2 (H) 0.0 - 4.4 ratio    Comment:                                   T. Chol/HDL Ratio                                             Men  Women                               1/2  Avg.Risk  3.4    3.3                                   Avg.Risk  5.0    4.4                                2X Avg.Risk  9.6    7.1                                3X Avg.Risk 23.4   11.0   VITAMIN D 25 Hydroxy (Vit-D Deficiency, Fractures)     Status: None   Collection Time: 02/13/23 10:37 AM  Result Value Ref Range   Vit D, 25-Hydroxy 32.6 30.0 - 100.0 ng/mL    Comment: Vitamin D deficiency has been defined by the Institute of Medicine and an Endocrine Society practice guideline as a level of serum 25-OH vitamin D less than 20 ng/mL (1,2). The Endocrine Society went on to further define vitamin D insufficiency as a level between 21 and 29 ng/mL (2). 1. IOM (Institute of Medicine). 2010. Dietary reference    intakes for calcium and D. [place]: The    [employer]. 2. Holick MF, Binkley , Bischoff-Ferrari HA, et al.    Evaluation, treatment, and prevention of vitamin D    deficiency: an Endocrine Society clinical practice    guideline. JCEM. 2011 Jul; 96(7):1911-30.   CBC With Differential     Status: None  Collection Time: 02/13/23 10:37 AM  Result Value Ref Range   WBC 7.2 3.4 - 10.8 x10E3/uL   RBC 4.67 3.77 - 5.28 x10E6/uL   Hemoglobin 13.5 11.1 - 15.9 g/dL   Hematocrit 16.1 09.6 - 46.6 %   MCV 88 79 - 97 fL   MCH 28.9 26.6 - 33.0 pg   MCHC 32.7 31.5 - 35.7 g/dL   RDW 04.5 40.9 - 81.1 %   Neutrophils 64 Not Estab. %   Lymphs 24 Not Estab. %   Monocytes 6 Not Estab. %   Eos 4 Not Estab. %   Basos 2 Not Estab. %   Neutrophils Absolute 4.6 1.4 - 7.0 x10E3/uL   Lymphocytes Absolute 1.8 0.7 - 3.1 x10E3/uL   Monocytes Absolute 0.4 0.1 - 0.9 x10E3/uL   EOS (ABSOLUTE) 0.3 0.0 - 0.4 x10E3/uL   Basophils Absolute 0.1 0.0 - 0.2 x10E3/uL   Immature Granulocytes 0 Not Estab. %   Immature Grans (Abs) 0.0 0.0 - 0.1 x10E3/uL  CMP14+EGFR     Status: Abnormal   Collection Time: 02/13/23 10:37 AM  Result Value Ref Range   Glucose 154 (H) 70 - 99 mg/dL   BUN 10 6 -  24 mg/dL   Creatinine, Ser 9.14 0.57 - 1.00 mg/dL   eGFR 782 >95 AO/ZHY/8.65   BUN/Creatinine Ratio 14 9 - 23   Sodium 136 134 - 144 mmol/L   Potassium 4.2 3.5 - 5.2 mmol/L   Chloride 101 96 - 106 mmol/L   CO2 21 20 - 29 mmol/L   Calcium 8.6 (L) 8.7 - 10.2 mg/dL   Total Protein 6.6 6.0 - 8.5 g/dL   Albumin 4.0 3.9 - 4.9 g/dL   Globulin, Total 2.6 1.5 - 4.5 g/dL   Albumin/Globulin Ratio 1.5 1.2 - 2.2   Bilirubin Total 0.4 0.0 - 1.2 mg/dL   Alkaline Phosphatase 112 44 - 121 IU/L   AST 11 0 - 40 IU/L   ALT 11 0 - 32 IU/L  TSH     Status: None   Collection Time: 02/13/23 10:37 AM  Result Value Ref Range   TSH 0.895 0.450 - 4.500 uIU/mL  Hemoglobin A1c     Status: Abnormal   Collection Time: 02/13/23 10:37 AM  Result Value Ref Range   Hgb A1c MFr Bld 7.5 (H) 4.8 - 5.6 %    Comment:          Prediabetes: 5.7 - 6.4          Diabetes: >6.4          Glycemic control for adults with diabetes: <7.0    Est. average glucose Bld gHb Est-mCnc 169 mg/dL  Vitamin H84     Status: None   Collection Time: 02/13/23 10:37 AM  Result Value Ref Range   Vitamin B-12 561 232 - 1,245 pg/mL  Hepatitis C Ab reflex to Quant PCR     Status: None   Collection Time: 02/13/23 10:37 AM  Result Value Ref Range   HCV Ab Non Reactive Non Reactive  Iron, TIBC and Ferritin Panel     Status: None   Collection Time: 02/13/23 10:37 AM  Result Value Ref Range   Total Iron Binding Capacity 304 250 - 450 ug/dL   UIBC 696 295 - 284 ug/dL   Iron 132 27 - 440 ug/dL   Iron Saturation 34 15 - 55 %   Ferritin 15 15 - 150 ng/mL  Interpretation:     Status: None  Collection Time: 02/13/23 10:37 AM  Result Value Ref Range   HCV Interp 1: Comment     Comment: Not infected with HCV unless early or acute infection is suspected (which may be delayed in an immunocompromised individual), or other evidence exists to indicate HCV infection.   H. pylori breath test     Status: None   Collection Time: 02/13/23  1:14 PM   Result Value Ref Range   H pylori Breath Test Negative Negative       Assessment & Plan:   Problem List Items Addressed This Visit   None Visit Diagnoses     Contact with and (suspected) exposure to mold (toxic)    -  Primary   Relevant Orders   MMP-9 (Matrix metalloprot.-9)   ADH   Celiac Ab tTG DGP TIgA   HLA DR15   Celiac Disease HLA DQ Assoc.   Allergen Profile, Mold   Brain fog           Return in about 3 months (around 05/22/2023) for F/U.   Total time spent: 30 minutes  Miki Kins, FNP  02/19/2023   This document may have been prepared by [facility] Voice Recognition software and as such may include unintentional dictation errors.

## 2023-03-01 ENCOUNTER — Telehealth: Payer: Self-pay | Admitting: Family

## 2023-03-01 NOTE — Telephone Encounter (Signed)
Patient called in to see if her CT has been scheduled. Transferred call to [employer].

## 2023-03-06 ENCOUNTER — Telehealth: Payer: Self-pay | Admitting: Family

## 2023-03-06 NOTE — Telephone Encounter (Signed)
Patient left VM that insurance did not approve her neck CT, they said she needed to have an ultrasound first. She states that she has already had an ultrasound. What should she do next?

## 2023-03-15 LAB — CELIAC AB TTG DGP TIGA
Antigliadin Abs, IgA: 18 units (ref 0–19)
Gliadin IgG: 3 units (ref 0–19)
IgA/Immunoglobulin A, Serum: 152 mg/dL (ref 87–352)
Tissue Transglut Ab: 3 U/mL (ref 0–5)
Transglutaminase IgA: <2 U/mL (ref 0–3)

## 2023-03-15 LAB — ADH: ADH: 1.7 pg/mL (ref 0.0–4.7)

## 2023-03-15 LAB — ALLERGEN PROFILE, MOLD
Alternaria Alternata IgE: <0.1 kU/L
Aspergillus Fumigatus IgE: <0.1 kU/L
Aureobasidi Pullulans IgE: <0.1 kU/L
Candida Albicans IgE: <0.1 kU/L
Cladosporium Herbarum IgE: <0.1 kU/L
M009-IgE Fusarium proliferatum: <0.1 kU/L
M014-IgE Epicoccum purpur: <0.1 kU/L
Mucor Racemosus IgE: <0.1 kU/L
Penicillium Chrysogen IgE: <0.1 kU/L
Phoma Betae IgE: <0.1 kU/L
Setomelanomma Rostrat: <0.1 kU/L
Stemphylium Herbarum IgE: <0.1 kU/L

## 2023-03-15 LAB — HLA DR15: HLA DR15: NEGATIVE

## 2023-03-15 LAB — CELIAC DISEASE HLA DQ ASSOC.
DQ2 (DQA1 0501/0505,DQB1 02XX): NEGATIVE
DQ8 (DQA1 03XX, DQB1 0302): NEGATIVE

## 2023-03-15 LAB — MMP-9 (MATRIX METALLOPROT.-9): MMP9: 1141 ng/mL — ABNORMAL HIGH

## 2023-03-26 ENCOUNTER — Telehealth: Payer: Self-pay | Admitting: Family

## 2023-03-26 NOTE — Telephone Encounter (Signed)
Patient left VM that she is having stomach issues with Trulicity, can we prescribe Ozempic? Also needs her most recent lab results. And her insurance has not approved the MRI because they say she needs an U/S done first, but she has already done the U/S.

## 2023-04-02 ENCOUNTER — Other Ambulatory Visit: Payer: Self-pay | Admitting: Family

## 2023-04-06 ENCOUNTER — Other Ambulatory Visit: Payer: Self-pay | Admitting: Family

## 2023-04-13 ENCOUNTER — Other Ambulatory Visit: Payer: Self-pay

## 2023-04-13 ENCOUNTER — Other Ambulatory Visit: Payer: Self-pay | Admitting: Family

## 2023-04-13 MED ORDER — SEMAGLUTIDE(0.25 OR 0.5MG/DOS) 2 MG/3ML ~~LOC~~ SOPN: 0.5000 mg | PEN_INJECTOR | SUBCUTANEOUS | 0 refills | Status: DC

## 2023-04-16 ENCOUNTER — Other Ambulatory Visit: Payer: Self-pay | Admitting: Family

## 2023-04-16 DIAGNOSIS — R590 Localized enlarged lymph nodes: Secondary | ICD-10-CM

## 2023-04-19 NOTE — Telephone Encounter (Signed)
Done already

## 2023-04-25 IMAGING — MG MM DIGITAL SCREENING BILAT W/ TOMO AND CAD
6 series · 6 of 14 positions shown · non-contrast
Comparison: Previous exam(s).

CLINICAL DATA: Screening.

EXAM:
DIGITAL SCREENING BILATERAL MAMMOGRAM WITH TOMOSYNTHESIS AND CAD
TECHNIQUE: Bilateral screening digital craniocaudal and mediolateral oblique
mammograms were obtained. Bilateral screening digital breast
tomosynthesis was performed. The images were evaluated with
computer-aided detection.

[L MLO synth-2D]
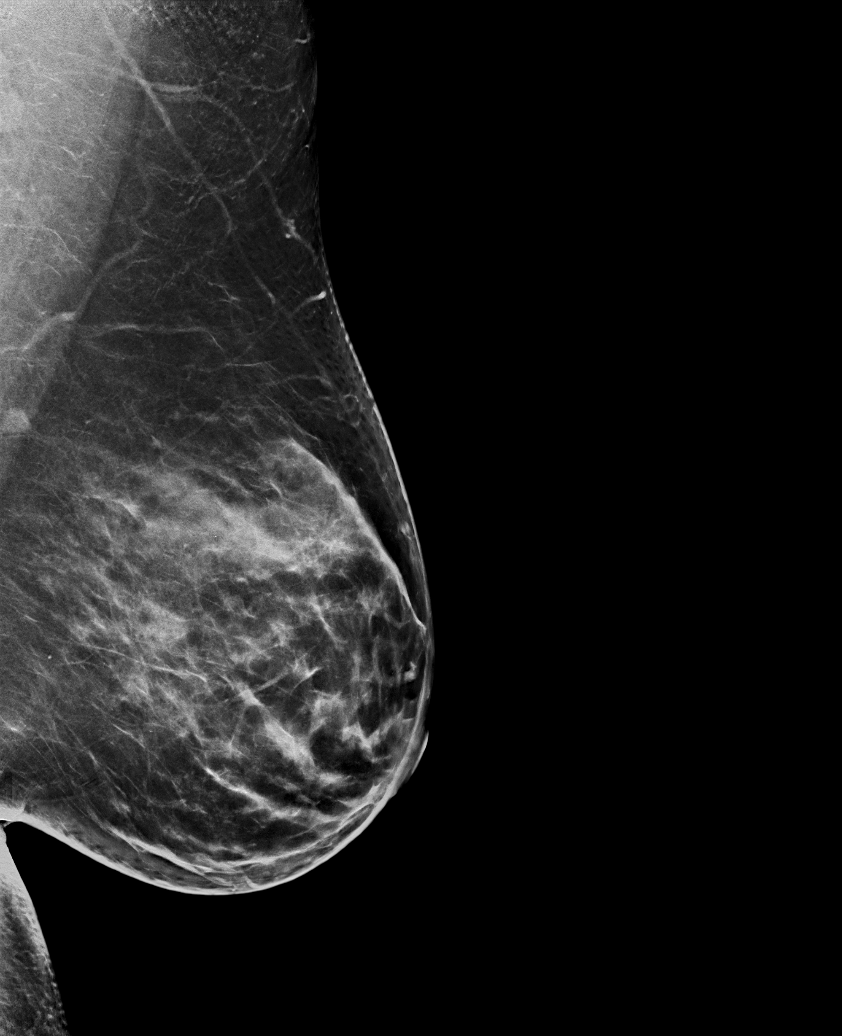

[L CC synth-2D]
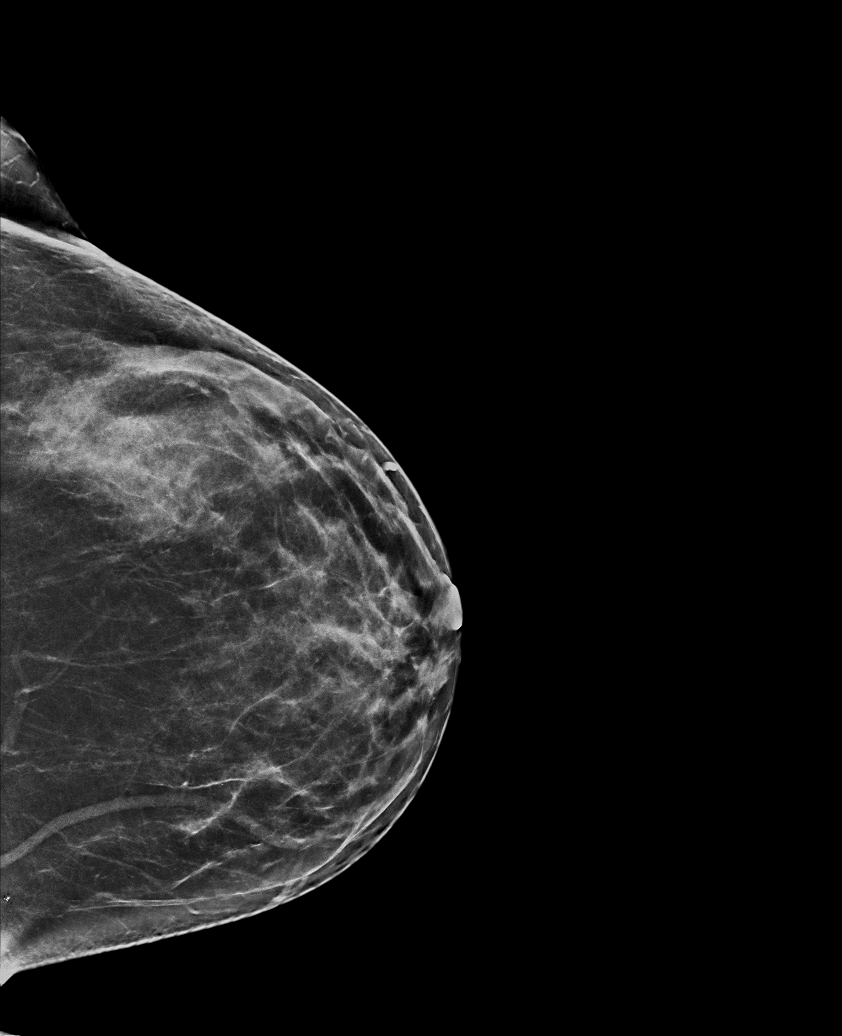

[R MLO synth-2D]
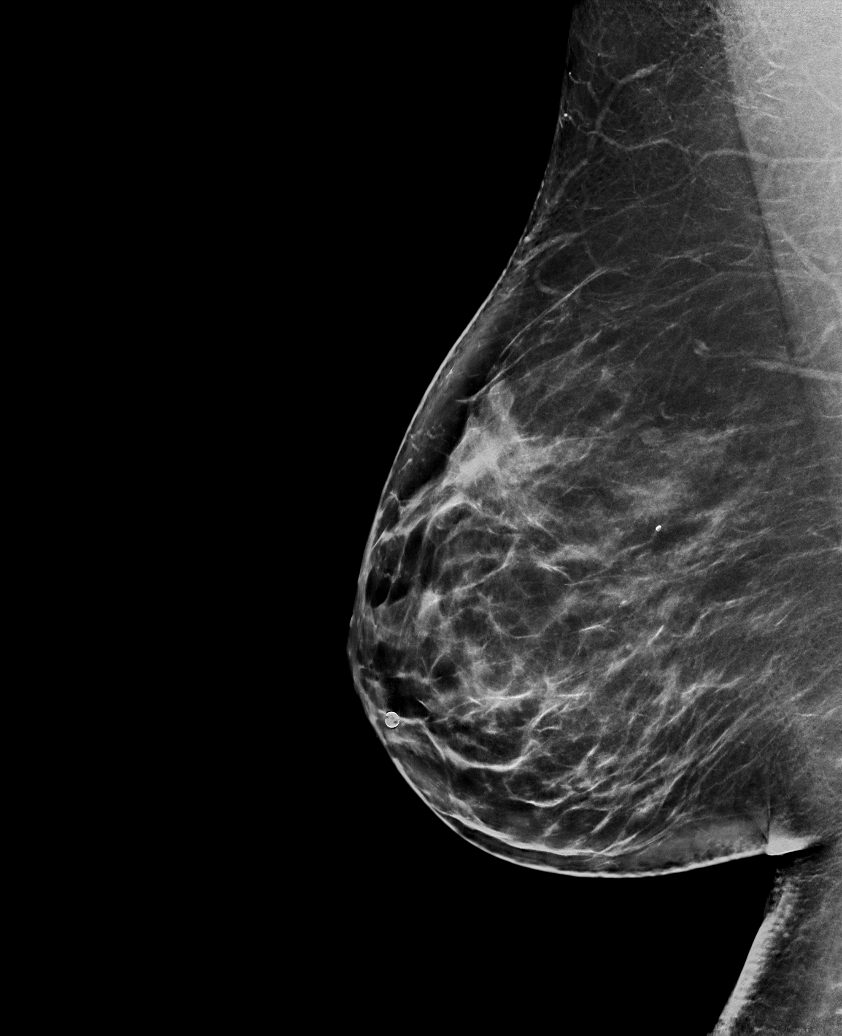

[R CC synth-2D]
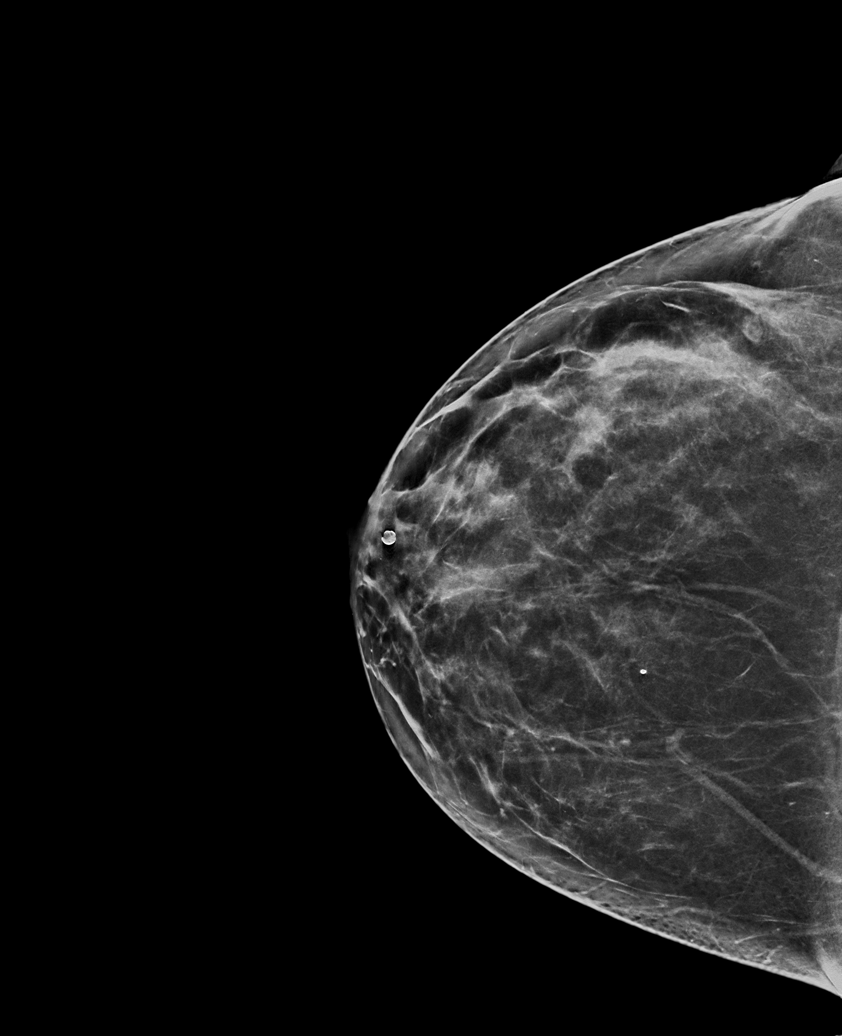

[R MLO tomo · tomo slice 47/93.0]
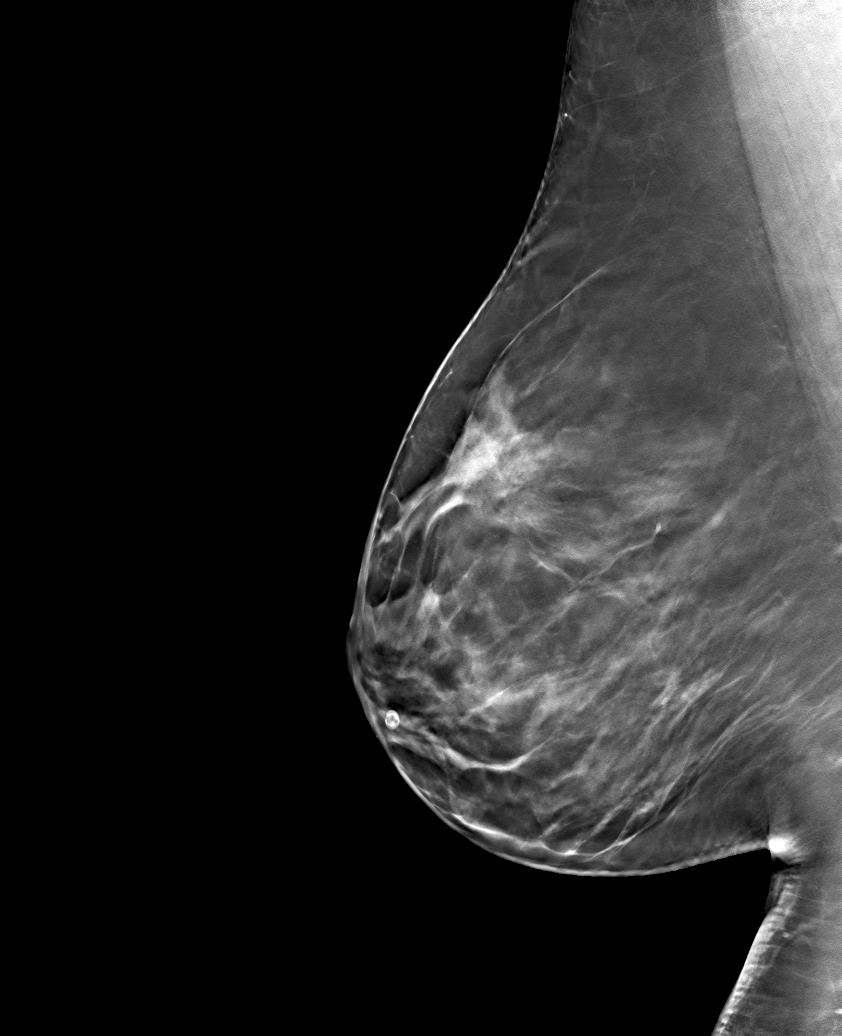

[R CC tomo · tomo slice 43/86.0]
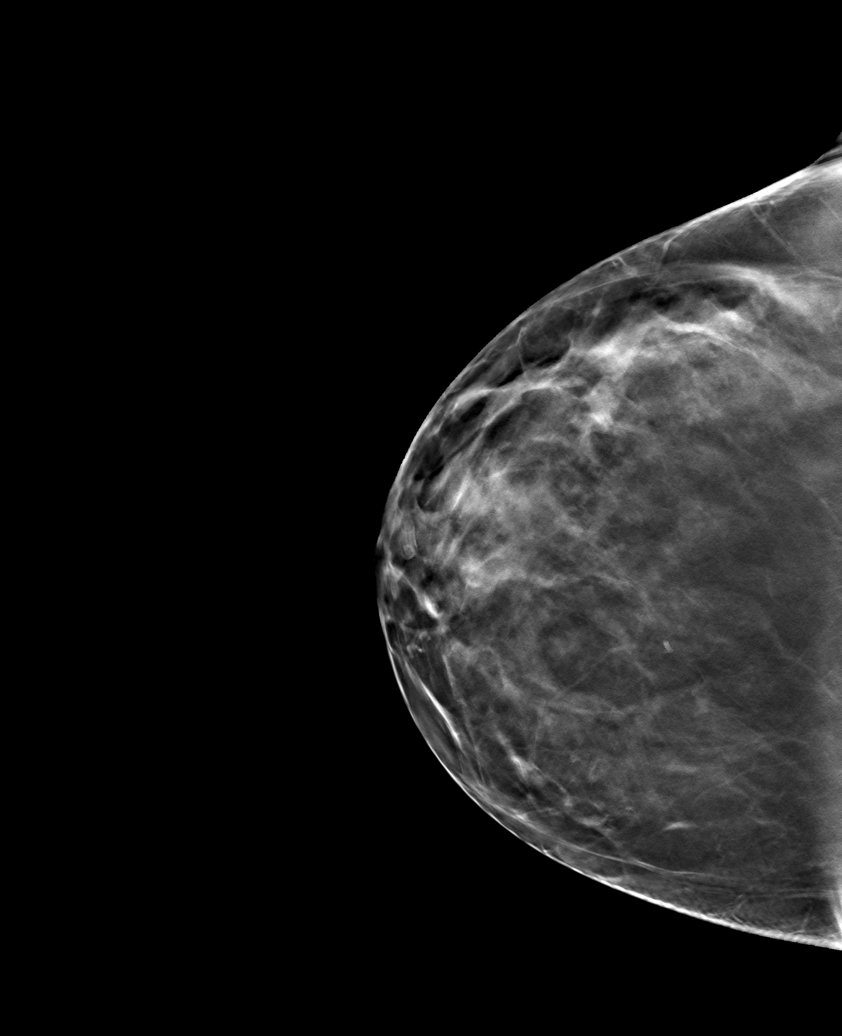

[6 of 14 positions shown; findings below may reference images not displayed]

ACR Breast Density Category c: The breast tissue is heterogeneously
dense, which may obscure small masses.
FINDINGS: There are no findings suspicious for malignancy.
IMPRESSION: No mammographic evidence of malignancy. A result letter of this
screening mammogram will be mailed directly to the patient.

RECOMMENDATION:
Screening mammogram in one year. (Code:[id number]-[id number])

BI-RADS CATEGORY  1: Negative.

## 2023-05-02 ENCOUNTER — Other Ambulatory Visit: Payer: Self-pay | Admitting: Family

## 2023-05-08 ENCOUNTER — Ambulatory Visit (INDEPENDENT_AMBULATORY_CARE_PROVIDER_SITE_OTHER): Payer: 59

## 2023-05-08 DIAGNOSIS — R590 Localized enlarged lymph nodes: Secondary | ICD-10-CM | POA: Diagnosis not present

## 2023-05-08 MED ORDER — IOHEXOL 300 MG/ML  SOLN
100.0000 mL | Freq: Once | INTRAMUSCULAR | Status: AC | PRN
Start: 2023-05-08 — End: 2023-05-08
  Administered 2023-05-08: 100 mL via INTRAVENOUS

## 2023-05-10 ENCOUNTER — Telehealth: Payer: Self-pay

## 2023-05-10 NOTE — Telephone Encounter (Signed)
Pt left voicemail requesting call back in reference to her CT scan. Pt was informed that once results were received and reviewed by provider a nurse would reach out with any comments needing to be passed along.

## 2023-05-14 ENCOUNTER — Telehealth: Payer: Self-pay | Admitting: Family

## 2023-05-14 NOTE — Telephone Encounter (Signed)
Patient left VM wanting her CT results. Advised her that we have not received the report yet from the radiologist.

## 2023-05-17 ENCOUNTER — Other Ambulatory Visit: Payer: Self-pay | Admitting: Family

## 2023-05-22 ENCOUNTER — Ambulatory Visit (INDEPENDENT_AMBULATORY_CARE_PROVIDER_SITE_OTHER): Payer: 59 | Admitting: Family

## 2023-05-22 ENCOUNTER — Ambulatory Visit
Admission: RE | Admit: 2023-05-22 | Discharge: 2023-05-22 | Disposition: A | Discharge status: Home / Self Care | Payer: 59 | Attending: Family | Admitting: Family

## 2023-05-22 ENCOUNTER — Ambulatory Visit
Admission: RE | Admit: 2023-05-22 | Discharge: 2023-05-22 | Disposition: A | Discharge status: Home / Self Care | Payer: 59 | Source: Ambulatory Visit | Attending: Family | Admitting: Family

## 2023-05-22 ENCOUNTER — Encounter: Payer: Self-pay | Admitting: Family

## 2023-05-22 VITALS — BP 135/85 | HR 78 | Ht 69.0 in | Wt 224.8 lb

## 2023-05-22 DIAGNOSIS — E1165 Type 2 diabetes mellitus with hyperglycemia: Secondary | ICD-10-CM

## 2023-05-22 DIAGNOSIS — S83001A Unspecified subluxation of right patella, initial encounter: Secondary | ICD-10-CM

## 2023-05-22 DIAGNOSIS — E559 Vitamin D deficiency, unspecified: Secondary | ICD-10-CM

## 2023-05-22 DIAGNOSIS — M797 Fibromyalgia: Secondary | ICD-10-CM

## 2023-05-22 DIAGNOSIS — M25561 Pain in right knee: Secondary | ICD-10-CM | POA: Diagnosis not present

## 2023-05-22 DIAGNOSIS — E538 Deficiency of other specified B group vitamins: Secondary | ICD-10-CM

## 2023-05-22 DIAGNOSIS — D110 Benign neoplasm of parotid gland: Secondary | ICD-10-CM | POA: Insufficient documentation

## 2023-05-22 HISTORY — DX: Unspecified subluxation of right patella, initial encounter: S83.001A

## 2023-05-22 NOTE — Assessment & Plan Note (Signed)
Sending to ENT for further evaluation.  Will defer to them going forward.

## 2023-05-22 NOTE — Progress Notes (Signed)
Established Patient Office Visit  Subjective:  Patient ID: Kiara Woodard, female    DOB: 1976/08/14  Age: 47 y.o. MRN: [id number]  Chief Complaint  Patient presents with   Follow-up    3 mo f/u    Patient is here today for her 3 months follow up.  She has been feeling poorly since last appointment.   She does have additional concerns to discuss today.  Labs are not due today. She needs refills.   I have reviewed her active problem list, medication list, allergies, notes from last encounter, lab results for her appointment today.   No other concerns at this time.   Past Medical History:  Diagnosis Date   Chronic fatigue 02/18/2020   Fibromyalgia 02/18/2020   Hypothyroidism (acquired) 06/26/2019   Lumbar radiculopathy 02/12/2014   Type 2 diabetes mellitus without complication, without long-term current use of insulin (HCC) 06/26/2019    History reviewed. No pertinent surgical history.  Social History   Socioeconomic History   Marital status: Married    Spouse name: Not on file   Number of children: Not on file   Years of education: Not on file   Highest education level: Not on file  Occupational History   Not on file  Tobacco Use   Smoking status: Former    Types: Cigarettes   Smokeless tobacco: Never  Substance and Sexual Activity   Alcohol use: Not on file   Drug use: Not on file   Sexual activity: Not on file  Other Topics Concern   Not on file  Social History Narrative   Not on file   Social Determinants of Health   Financial Resource Strain: Not on file  Food Insecurity: Not on file  Transportation Needs: Not on file  Physical Activity: Not on file  Stress: Not on file  Social Connections: Not on file  Intimate Partner Violence: Not on file    Family History  Problem Relation Age of Onset   Breast cancer Maternal Aunt        mat great aunt    Allergies  Allergen Reactions   Penicillin G Itching    Fever, aches, itching    Review  of Systems  Constitutional:  Positive for malaise/fatigue.       Objective:   BP 135/85   Pulse 78   Ht 5\' 9"  (1.753 m)   Wt 224 lb 12.8 oz (102 kg)   LMP 05/01/2023   SpO2 99%   BMI 33.20 kg/m   Vitals:   05/22/23 1400  BP: 135/85  Pulse: 78  Height: 5\' 9"  (1.753 m)  Weight: 224 lb 12.8 oz (102 kg)  SpO2: 99%  BMI (Calculated): 33.18    Physical Exam Vitals and nursing note reviewed.  Constitutional:      General: She is awake.     Appearance: Normal appearance. She is well-developed, well-groomed and overweight.  HENT:     Head: Normocephalic.  Eyes:     Pupils: Pupils are equal, round, and reactive to light.  Cardiovascular:     Rate and Rhythm: Normal rate.  Pulmonary:     Effort: Pulmonary effort is normal.     Breath sounds: Normal breath sounds.  Neurological:     Mental Status: She is alert.  Psychiatric:        Behavior: Behavior is cooperative.      No results found for any visits on 05/22/23.  No results found for this or any previous visit (from  the past 2160 hour(s)).     Assessment & Plan:   Problem List Items Addressed This Visit       Active Problems   Fibromyalgia    Patient stable.  Well controlled with current therapy.   Continue current meds.       Type 2 diabetes mellitus with hyperglycemia, without long-term current use of insulin (HCC) - Primary    Checking labs today. Will call pt. With results  Continue current diabetes POC, as patient has been well controlled on current regimen.  Will adjust meds if needed based on labs.       Patellar subluxation, right, initial encounter    Getting x-ray today of her right knee Will call with results.   Reassess at follow up.       Relevant Orders   DG Knee Complete 4 Views Right   Parotid adenoma    Sending to ENT for further evaluation.  Will defer to them going forward.        Relevant Orders   Ambulatory referral to ENT   Other Visit Diagnoses     B12  deficiency due to diet       Checking labs today.  Will continue supplements as needed.   Vitamin D deficiency, unspecified       Checking labs today.  Will continue supplements as needed.       Return in about 3 months (around 08/21/2023).   Total time spent: 30 minutes  Miki Kins, FNP  05/22/2023   This document may have been prepared by [facility] Voice Recognition software and as such may include unintentional dictation errors.

## 2023-05-22 NOTE — Assessment & Plan Note (Signed)
Getting x-ray today of her right knee Will call with results.   Reassess at follow up.

## 2023-05-22 NOTE — Assessment & Plan Note (Signed)
Checking labs today. Will call pt. With results  Continue current diabetes POC, as patient has been well controlled on current regimen.  Will adjust meds if needed based on labs.  

## 2023-05-22 NOTE — Assessment & Plan Note (Signed)
Patient stable.  Well controlled with current therapy.   Continue current meds.  

## 2023-05-23 LAB — POC CREATINE & ALBUMIN,URINE
Creatinine, POC: 200 mg/dL
Microalbumin Ur, POC: 80 mg/L

## 2023-05-23 NOTE — Addendum Note (Signed)
Addended by: Gwynne Edinger on: 05/23/2023 10:54 AM   Modules accepted: Orders

## 2023-05-31 ENCOUNTER — Encounter: Payer: Self-pay | Admitting: Family

## 2023-05-31 ENCOUNTER — Other Ambulatory Visit: Payer: Self-pay | Admitting: Family

## 2023-06-14 DIAGNOSIS — D37030 Neoplasm of uncertain behavior of the parotid salivary glands: Secondary | ICD-10-CM | POA: Diagnosis not present

## 2023-06-18 ENCOUNTER — Ambulatory Visit: Payer: 59 | Admitting: Family

## 2023-06-19 ENCOUNTER — Inpatient Hospital Stay
Admission: RE | Admit: 2023-06-19 | Discharge: 2023-06-19 | Disposition: A | Discharge status: Home / Self Care | Payer: Self-pay | Source: Ambulatory Visit | Attending: Otolaryngology | Admitting: Otolaryngology

## 2023-06-19 ENCOUNTER — Ambulatory Visit: Payer: 59 | Admitting: Family

## 2023-06-19 ENCOUNTER — Other Ambulatory Visit: Payer: Self-pay | Admitting: Otolaryngology

## 2023-06-19 DIAGNOSIS — D490 Neoplasm of unspecified behavior of digestive system: Secondary | ICD-10-CM

## 2023-06-21 ENCOUNTER — Other Ambulatory Visit: Payer: Self-pay | Admitting: Otolaryngology

## 2023-06-21 DIAGNOSIS — K118 Other diseases of salivary glands: Secondary | ICD-10-CM

## 2023-06-21 NOTE — Progress Notes (Signed)
Oley Balm, MD sent to Kiara Woodard S PROCEDURE / BIOPSY REVIEW Date: 06/20/23  Requested Biopsy site: L parotid nodule Reason for request: nodule Imaging review: Best seen on CT 05/08/23  Decision: Approved Imaging modality to perform: Ultrasound Schedule with: Patient preference (Local vs Mod Sed) Schedule for: Any VIR  Additional comments:   Please contact me with questions, concerns, or if issue pertaining to this request arise.  Dayne Oley Balm, MD [facility]

## 2023-06-28 ENCOUNTER — Other Ambulatory Visit: Payer: Self-pay | Admitting: Family

## 2023-06-29 NOTE — Progress Notes (Signed)
Patient for US guided Core LT Parotid Nodule Biopsy on Monday 07/02/2023, I called and spoke with the patient on the phone and gave pre-procedure instructions. Pt was made aware to be here at 12:30p and check in at the [facility]. Pt stated understanding.  Called 06/29/2023

## 2023-07-02 ENCOUNTER — Ambulatory Visit
Admission: RE | Admit: 2023-07-02 | Discharge: 2023-07-02 | Disposition: A | Discharge status: Home / Self Care | Payer: Medicaid Other | Source: Ambulatory Visit | Attending: Otolaryngology | Admitting: Otolaryngology

## 2023-07-02 DIAGNOSIS — K118 Other diseases of salivary glands: Secondary | ICD-10-CM | POA: Diagnosis present

## 2023-07-02 MED ORDER — LIDOCAINE HCL (PF) 1 % IJ SOLN
6.0000 mL | Freq: Once | INTRAMUSCULAR | Status: AC
  Administered 2023-07-02: 6 mL via INTRADERMAL
  Filled 2023-07-02: qty 6

## 2023-07-03 ENCOUNTER — Other Ambulatory Visit: Payer: Self-pay

## 2023-07-03 ENCOUNTER — Emergency Department
Admission: EM | Admit: 2023-07-03 | Discharge: 2023-07-04 | Disposition: A | Discharge status: Home / Self Care | Payer: Medicaid Other | Attending: Emergency Medicine | Admitting: Emergency Medicine

## 2023-07-03 DIAGNOSIS — R112 Nausea with vomiting, unspecified: Secondary | ICD-10-CM | POA: Diagnosis not present

## 2023-07-03 DIAGNOSIS — R1084 Generalized abdominal pain: Secondary | ICD-10-CM | POA: Diagnosis present

## 2023-07-03 DIAGNOSIS — R519 Headache, unspecified: Secondary | ICD-10-CM | POA: Diagnosis not present

## 2023-07-03 DIAGNOSIS — R42 Dizziness and giddiness: Secondary | ICD-10-CM

## 2023-07-03 DIAGNOSIS — E119 Type 2 diabetes mellitus without complications: Secondary | ICD-10-CM | POA: Insufficient documentation

## 2023-07-03 DIAGNOSIS — Z79899 Other long term (current) drug therapy: Secondary | ICD-10-CM | POA: Diagnosis not present

## 2023-07-03 DIAGNOSIS — E039 Hypothyroidism, unspecified: Secondary | ICD-10-CM | POA: Diagnosis not present

## 2023-07-03 DIAGNOSIS — R197 Diarrhea, unspecified: Secondary | ICD-10-CM | POA: Insufficient documentation

## 2023-07-03 DIAGNOSIS — D72829 Elevated white blood cell count, unspecified: Secondary | ICD-10-CM | POA: Insufficient documentation

## 2023-07-03 LAB — CBC
HCT: 38 % (ref 36.0–46.0)
Hemoglobin: 12.1 g/dL (ref 12.0–15.0)
MCH: 27.9 pg (ref 26.0–34.0)
MCHC: 31.8 g/dL (ref 30.0–36.0)
MCV: 87.6 fL (ref 80.0–100.0)
Platelets: 351 10*3/uL (ref 150–400)
RBC: 4.34 MIL/uL (ref 3.87–5.11)
RDW: 14.6 % (ref 11.5–15.5)
WBC: 11 10*3/uL — ABNORMAL HIGH (ref 4.0–10.5)
nRBC: 0 % (ref 0.0–0.2)

## 2023-07-03 LAB — COMPREHENSIVE METABOLIC PANEL
ALT: 14 U/L (ref 0–44)
AST: 13 U/L — ABNORMAL LOW (ref 15–41)
Albumin: 3.8 g/dL (ref 3.5–5.0)
Alkaline Phosphatase: 91 U/L (ref 38–126)
Anion gap: 8 (ref 5–15)
BUN: 9 mg/dL (ref 6–20)
CO2: 24 mmol/L (ref 22–32)
Calcium: 8.6 mg/dL — ABNORMAL LOW (ref 8.9–10.3)
Chloride: 99 mmol/L (ref 98–111)
Creatinine, Ser: 0.7 mg/dL (ref 0.44–1.00)
GFR, Estimated: >60 mL/min (ref >60)
Glucose, Bld: 187 mg/dL — ABNORMAL HIGH (ref 70–99)
Potassium: 3.9 mmol/L (ref 3.5–5.1)
Sodium: 131 mmol/L — ABNORMAL LOW (ref 135–145)
Total Bilirubin: 0.3 mg/dL (ref 0.3–1.2)
Total Protein: 7.3 g/dL (ref 6.5–8.1)

## 2023-07-03 LAB — SURGICAL PATHOLOGY

## 2023-07-03 LAB — LIPASE, BLOOD: Lipase: 37 U/L (ref 11–51)

## 2023-07-03 MED ORDER — ONDANSETRON 4 MG PO TBDP
4.0000 mg | ORAL_TABLET | Freq: Once | ORAL | Status: AC
  Administered 2023-07-03: 4 mg via ORAL
  Filled 2023-07-03: qty 1

## 2023-07-03 MED ORDER — ONDANSETRON HCL 4 MG/2ML IJ SOLN
4.0000 mg | Freq: Once | INTRAMUSCULAR | Status: AC
  Administered 2023-07-04: 4 mg via INTRAVENOUS
  Filled 2023-07-03: qty 2

## 2023-07-03 MED ORDER — SODIUM CHLORIDE 0.9 % IV BOLUS (SEPSIS)
1000.0000 mL | Freq: Once | INTRAVENOUS | Status: AC
  Administered 2023-07-04: 1000 mL via INTRAVENOUS

## 2023-07-03 MED ORDER — KETOROLAC TROMETHAMINE 30 MG/ML IJ SOLN
30.0000 mg | Freq: Once | INTRAMUSCULAR | Status: AC
  Administered 2023-07-04: 30 mg via INTRAVENOUS
  Filled 2023-07-03: qty 1

## 2023-07-03 NOTE — ED Triage Notes (Signed)
Pt presents to ER with c/o abd pain intermittently x1 week.  Pt reports she has not had a normal BM in over a week, along with some vomiting.  Pt states pain is in lower abdomen.  Denies any pain with urination, but states she has noted some hematuria, and that it appears darker than normal.  Pt is otherwise A&Ox4 and in NAD at this time.

## 2023-07-04 ENCOUNTER — Emergency Department: Payer: Medicaid Other

## 2023-07-04 LAB — URINALYSIS, ROUTINE W REFLEX MICROSCOPIC
Bilirubin Urine: NEGATIVE
Glucose, UA: NEGATIVE mg/dL
Hgb urine dipstick: NEGATIVE
Ketones, ur: 5 mg/dL — AB
Leukocytes,Ua: NEGATIVE
Nitrite: NEGATIVE
Protein, ur: NEGATIVE mg/dL
Specific Gravity, Urine: 1.008 (ref 1.005–1.030)
pH: 6 (ref 5.0–8.0)

## 2023-07-04 LAB — POC URINE PREG, ED: Preg Test, Ur: NEGATIVE

## 2023-07-04 MED ORDER — IOHEXOL 350 MG/ML SOLN
100.0000 mL | Freq: Once | INTRAVENOUS | Status: AC | PRN
  Administered 2023-07-04: 100 mL via INTRAVENOUS

## 2023-07-04 MED ORDER — ONDANSETRON 4 MG PO TBDP: 4.0000 mg | ORAL_TABLET | Freq: Four times a day (QID) | ORAL | 0 refills | Status: DC | PRN

## 2023-07-04 NOTE — Discharge Instructions (Addendum)
You may alternate Tylenol 1000 mg every 6 hours as needed for pain, fever and Ibuprofen 800 mg every 6-8 hours as needed for pain, fever.  Please take Ibuprofen with food.  Do not take more than 4000 mg of Tylenol (acetaminophen) in a 24 hour period.   I recommend a bland diet for the next several days.  You may use Imodium over-the-counter as needed for diarrhea.  Please increase your fluid intake.

## 2023-07-04 NOTE — ED Notes (Signed)
Patient was given water and saltine crackers.  She was able to eat and drink.  After 15 minutes, patient has no complaints of nausea or vomiting.

## 2023-07-04 NOTE — ED Provider Notes (Signed)
[facility] Provider Note    Event Date/Time   First MD Initiated Contact with Patient 07/03/23 2315     (approximate)   History   Abdominal Pain   HPI  Kiara Woodard is a 47 y.o. female with fibromyalgia, chronic fatigue, type 2 diabetes, hypothyroidism who presents to the emergency department with a week of feeling poorly.  Has had intermittent nausea, vomiting and diarrhea.  Also reports intermittent headaches and feeling lightheaded.  No chest pain or shortness of breath.  Complains of generalized abdominal discomfort.  No dysuria but has had hematuria.  No vaginal bleeding or discharge.  Denies previous abdominal surgery.   History provided by patient.    Past Medical History:  Diagnosis Date   Chronic fatigue 02/18/2020   Fibromyalgia 02/18/2020   Hypothyroidism (acquired) 06/26/2019   Lumbar radiculopathy 02/12/2014   Type 2 diabetes mellitus without complication, without long-term current use of insulin (HCC) 06/26/2019    History reviewed. No pertinent surgical history.  MEDICATIONS:  Prior to Admission medications   Medication Sig Start Date End Date Taking? Authorizing Provider  DULoxetine (CYMBALTA) 60 MG capsule TAKE 1 CAPSULE BY MOUTH TWICE A DAY 05/31/23   Miki Kins, FNP  levothyroxine (SYNTHROID) 100 MCG tablet TAKE 1 TABLET BY MOTUH ONCE DAILY ON AN EMPTY STOMACH. WAIT 30 MINS BEFORE TAKING OTHER MEDS 06/29/23   Miki Kins, FNP  liothyronine (CYTOMEL) 5 MCG tablet Take 5 mcg by mouth 2 (two) times daily.    [provider]  losartan (COZAAR) 25 MG tablet TAKE 1 TABLET BY MOUTH EVERY DAY 05/17/23   Miki Kins, FNP  pregabalin (LYRICA) 150 MG capsule TAKE 1 CAPSULE BY MOUTH TWICE A DAY 05/03/23   Miki Kins, FNP  Semaglutide,0.25 or 0.5MG /DOS, 2 MG/3ML SOPN Inject 0.5 mg into the skin once a week. 04/13/23   Miki Kins, FNP  traMADol Janean Sark) 50 MG tablet TAKE 2 TABLETS BY MOUTH TWICE A DAY  04/08/23   Miki Kins, FNP    Physical Exam   Triage Vital Signs: ED Triage Vitals  Encounter Vitals Group     BP 07/03/23 2116 (!) 157/98     Systolic BP Percentile --      Diastolic BP Percentile --      Pulse Rate 07/03/23 2116 (!) 57     Resp 07/03/23 2116 16     Temp 07/03/23 2116 (!) 97.5 F (36.4 C)     Temp Source 07/03/23 2116 Oral     SpO2 07/03/23 2116 99 %     Weight 07/03/23 2117 225 lb (102.1 kg)     Height 07/03/23 2117 5\' 9"  (1.753 m)     Head Circumference --      Peak Flow --      Pain Score 07/03/23 2117 8     Pain Loc --      Pain Education --      Exclude from Growth Chart --     Most recent vital signs: Vitals:   07/04/23 0000 07/04/23 0316  BP: 131/84 (!) 138/93  Pulse: 62 64  Resp: 16 16  Temp: 97.8 F (36.6 C) 97.9 F (36.6 C)  SpO2: 96% 97%    CONSTITUTIONAL: Alert, responds appropriately to questions. Well-appearing; well-nourished HEAD: Normocephalic, atraumatic EYES: Conjunctivae clear, pupils appear equal, sclera nonicteric ENT: normal nose; moist mucous membranes NECK: Supple, normal ROM CARD: RRR; S1 and S2 appreciated RESP: Normal chest excursion without splinting  or tachypnea; breath sounds clear and equal bilaterally; no wheezes, no rhonchi, no rales, no hypoxia or respiratory distress, speaking full sentences ABD/GI: Non-distended; soft, generalized tenderness without guarding or rebound BACK: The back appears normal EXT: Normal ROM in all joints; no deformity noted, no edema SKIN: Normal color for age and race; warm; no rash on exposed skin NEURO: Moves all extremities equally, normal speech PSYCH: The patient's mood and manner are appropriate.   ED Results / Procedures / Treatments   LABS: (all labs ordered are listed, but only abnormal results are displayed) Labs Reviewed  CBC - Abnormal; Notable for the following components:      Result Value   WBC 11.0 (*)    All other components within normal limits   COMPREHENSIVE METABOLIC PANEL - Abnormal; Notable for the following components:   Sodium 131 (*)    Glucose, Bld 187 (*)    Calcium 8.6 (*)    AST 13 (*)    All other components within normal limits  URINALYSIS, ROUTINE W REFLEX MICROSCOPIC - Abnormal; Notable for the following components:   Color, Urine YELLOW (*)    APPearance CLEAR (*)    Ketones, ur 5 (*)    All other components within normal limits  LIPASE, BLOOD  POC URINE PREG, ED     EKG:  EKG Interpretation Date/Time:    Ventricular Rate:    PR Interval:    QRS Duration:    QT Interval:    QTC Calculation:   R Axis:      Text Interpretation:           RADIOLOGY: My personal review and interpretation of imaging: CT abdomen pelvis shows no acute abnormality.  I have personally reviewed all radiology reports.   CT ABDOMEN PELVIS W CONTRAST  Result Date: 07/04/2023 CLINICAL DATA:  Abdominal pain EXAM: CT ABDOMEN AND PELVIS WITH CONTRAST TECHNIQUE: Multidetector CT imaging of the abdomen and pelvis was performed using the standard protocol following bolus administration of intravenous contrast. RADIATION DOSE REDUCTION: This exam was performed according to the departmental dose-optimization program which includes automated exposure control, adjustment of the mA and/or kV according to patient size and/or use of iterative reconstruction technique. CONTRAST:  OMNIPAQUE IOHEXOL 350 MG/ML SOLN COMPARISON:  None Available. FINDINGS: Lower chest: No acute abnormality Hepatobiliary: No focal hepatic abnormality. Gallbladder unremarkable. Pancreas: No focal abnormality or ductal dilatation. Spleen: No focal abnormality.  Normal size. Adrenals/Urinary Tract: No adrenal abnormality. No focal renal abnormality. No stones or hydronephrosis. Urinary bladder is unremarkable. Stomach/Bowel: Stomach, large and small bowel grossly unremarkable. Normal appendix. Vascular/Lymphatic: No evidence of aneurysm or adenopathy. Reproductive:  3.5 cm fibroid in the right uterine fundus. No adnexal mass. Other: No free fluid or free air. Musculoskeletal: No acute bony abnormality. IMPRESSION: No acute findings in the abdomen or pelvis. Electronically Signed   By: Charlett Nose M.D.   On: 07/04/2023 01:58     PROCEDURES:  Critical Care performed: No   CRITICAL CARE Performed by: Baxter Hire Seline Enzor   Total critical care time: 0 minutes  Critical care time was exclusive of separately billable procedures and treating other patients.  Critical care was necessary to treat or prevent imminent or life-threatening deterioration.  Critical care was time spent personally by me on the following activities: development of treatment plan with patient and/or surrogate as well as nursing, discussions with consultants, evaluation of patient's response to treatment, examination of patient, obtaining history from patient or surrogate, ordering and performing treatments  and interventions, ordering and review of laboratory studies, ordering and review of radiographic studies, pulse oximetry and re-evaluation of patient's condition.   Marland Kitchen1-3 Lead EKG Interpretation  Performed by: Ranjit Ashurst, Layla Maw, DO Authorized by: Makai Agostinelli, Layla Maw, DO     Interpretation: normal     ECG rate:  64   ECG rate assessment: normal     Rhythm: sinus rhythm     Ectopy: none     Conduction: normal       IMPRESSION / MDM / ASSESSMENT AND PLAN / ED COURSE  I reviewed the triage vital signs and the nursing notes.    Patient here with nausea, vomiting, diarrhea, headaches, abdominal pain, dizziness.  The patient is on the cardiac monitor to evaluate for evidence of arrhythmia and/or significant heart rate changes.   DIFFERENTIAL DIAGNOSIS (includes but not limited to):   Viral gastroenteritis, dehydration, UTI, anemia, electrolyte derangement, colitis, diverticulitis, appendicitis, bowel obstruction, migraine   Patient's presentation is most consistent with acute  presentation with potential threat to life or bodily function.   PLAN: Will obtain labs, urine, CT of the abdomen pelvis.  Will give IV fluids, Toradol and Zofran for symptomatic relief.   MEDICATIONS GIVEN IN ED: Medications  ondansetron (ZOFRAN-ODT) disintegrating tablet 4 mg (4 mg Oral Given 07/03/23 2122)  sodium chloride 0.9 % bolus 1,000 mL (0 mLs Intravenous Stopped 07/04/23 0138)  ketorolac (TORADOL) 30 MG/ML injection 30 mg (30 mg Intravenous Given 07/04/23 0007)  ondansetron (ZOFRAN) injection 4 mg (4 mg Intravenous Given 07/04/23 0010)  iohexol (OMNIPAQUE) 350 MG/ML injection 100 mL (100 mLs Intravenous Contrast Given 07/04/23 0043)     ED COURSE: Labs show leukocytosis of 11,000.  Normal electrolytes, renal function, LFTs and lipase.  Urine shows no sign of infection and pregnancy test is negative.  CT scan reviewed and interpreted by myself and the radiologist and shows no acute abnormality.  Appendix is normal.  Patient reports feeling much better after IV medications and is tolerating p.o. here.  Discussed that symptoms could be secondary to viral illness but also she states that symptoms occurred right after her Trulicity injection.  While this is not a new medication for her, it could contribute to her GI symptoms and she will follow-up with her PCP for this.  Will discharge with Zofran.  Recommended Tylenol, Motrin, Imodium over-the-counter as needed.  Recommended a bland diet for the next 2 to 3 days.   At this time, I do not feel there is any life-threatening condition present. I reviewed all nursing notes, vitals, pertinent previous records.  All lab and urine results, EKGs, imaging ordered have been independently reviewed and interpreted by myself.  I reviewed all available radiology reports from any imaging ordered this visit.  Based on my assessment, I feel the patient is safe to be discharged home without further emergent workup and can continue workup as an outpatient as  needed. Discussed all findings, treatment plan as well as usual and customary return precautions.  They verbalize understanding and are comfortable with this plan.  Outpatient follow-up has been provided as needed.  All questions have been answered.    CONSULTS:  none   OUTSIDE RECORDS REVIEWED: Reviewed last endocrinology note on 06/20/2022.       FINAL CLINICAL IMPRESSION(S) / ED DIAGNOSES   Final diagnoses:  Nausea vomiting and diarrhea  Generalized headache  Lightheadedness     Rx / DC Orders   ED Discharge Orders  Ordered    ondansetron (ZOFRAN-ODT) 4 MG disintegrating tablet  Every 6 hours PRN        07/04/23 0224             Note:  This document was prepared using Dragon voice recognition software and may include unintentional dictation errors.   Atiya Yera, Layla Maw, DO 07/04/23 [phone]

## 2023-07-06 ENCOUNTER — Encounter: Payer: Self-pay | Admitting: Family

## 2023-07-11 ENCOUNTER — Other Ambulatory Visit: Payer: Self-pay | Admitting: Otolaryngology

## 2023-07-13 ENCOUNTER — Other Ambulatory Visit: Payer: Self-pay | Admitting: Family

## 2023-07-13 DIAGNOSIS — Z1231 Encounter for screening mammogram for malignant neoplasm of breast: Secondary | ICD-10-CM

## 2023-07-18 ENCOUNTER — Other Ambulatory Visit: Payer: Self-pay | Admitting: Family

## 2023-07-30 ENCOUNTER — Ambulatory Visit
Admission: RE | Admit: 2023-07-30 | Discharge: 2023-07-30 | Disposition: A | Discharge status: Home / Self Care | Payer: Medicaid Other | Source: Ambulatory Visit | Attending: Family | Admitting: Family

## 2023-07-30 DIAGNOSIS — Z1231 Encounter for screening mammogram for malignant neoplasm of breast: Secondary | ICD-10-CM | POA: Insufficient documentation

## 2023-08-01 ENCOUNTER — Other Ambulatory Visit: Payer: Self-pay | Admitting: Family

## 2023-08-03 ENCOUNTER — Ambulatory Visit: Payer: Medicaid Other | Admitting: Cardiology

## 2023-08-06 ENCOUNTER — Ambulatory Visit: Payer: Medicaid Other | Admitting: Family

## 2023-08-06 ENCOUNTER — Encounter: Payer: Self-pay | Admitting: Family

## 2023-08-06 VITALS — BP 122/80 | HR 77 | Ht 69.0 in | Wt 234.4 lb

## 2023-08-06 DIAGNOSIS — E039 Hypothyroidism, unspecified: Secondary | ICD-10-CM | POA: Diagnosis not present

## 2023-08-06 DIAGNOSIS — R300 Dysuria: Secondary | ICD-10-CM | POA: Diagnosis not present

## 2023-08-06 DIAGNOSIS — N760 Acute vaginitis: Secondary | ICD-10-CM | POA: Diagnosis not present

## 2023-08-06 DIAGNOSIS — E1165 Type 2 diabetes mellitus with hyperglycemia: Secondary | ICD-10-CM | POA: Diagnosis not present

## 2023-08-06 LAB — POCT URINALYSIS DIPSTICK
Bilirubin, UA: NEGATIVE
Blood, UA: NEGATIVE
Glucose, UA: POSITIVE — AB
Ketones, UA: NEGATIVE
Leukocytes, UA: NEGATIVE
Nitrite, UA: NEGATIVE
Protein, UA: NEGATIVE
Spec Grav, UA: 1.02 (ref 1.010–1.025)
Urobilinogen, UA: 0.2 U/dL
pH, UA: 6.5 (ref 5.0–8.0)

## 2023-08-06 LAB — POCT CBG (FASTING - GLUCOSE)-MANUAL ENTRY: Glucose Fasting, POC: 287 mg/dL — AB (ref 70–99)

## 2023-08-06 MED ORDER — SEMAGLUTIDE(0.25 OR 0.5MG/DOS) 2 MG/3ML ~~LOC~~ SOPN: PEN_INJECTOR | SUBCUTANEOUS | Status: AC

## 2023-08-06 NOTE — Progress Notes (Signed)
Established Patient Office Visit  Subjective:  Patient ID: Kiara Woodard, female    DOB: January 09, 1976  Age: 47 y.o. MRN: [id number]  Chief Complaint  Patient presents with   Acute Visit    Infection on left index finger     UTI vs STI?  Pelvic pressure/pain when urinating.   Unable to tolerate the Trulicity Needs Ozempic refilled.   Finger infection - better, but still there.     No other concerns at this time.   Past Medical History:  Diagnosis Date   Chronic fatigue 02/18/2020   Fibromyalgia 02/18/2020   Hypothyroidism (acquired) 06/26/2019   Lumbar radiculopathy 02/12/2014   Type 2 diabetes mellitus without complication, without long-term current use of insulin (HCC) 06/26/2019    No past surgical history on file.  Social History   Socioeconomic History   Marital status: Married    Spouse name: Not on file   Number of children: Not on file   Years of education: Not on file   Highest education level: Not on file  Occupational History   Not on file  Tobacco Use   Smoking status: Former    Types: Cigarettes   Smokeless tobacco: Never  Substance and Sexual Activity   Alcohol use: Not on file   Drug use: Not on file   Sexual activity: Not on file  Other Topics Concern   Not on file  Social History Narrative   Not on file   Social Determinants of Health   Financial Resource Strain: Not on file  Food Insecurity: Not on file  Transportation Needs: Not on file  Physical Activity: Not on file  Stress: Not on file  Social Connections: Not on file  Intimate Partner Violence: Not on file    Family History  Problem Relation Age of Onset   Breast cancer Maternal Aunt        mat great aunt    Allergies  Allergen Reactions   Penicillin G Itching    Fever, aches, itching    ROS     Objective:   BP 122/80   Pulse 77   Ht 5\' 9"  (1.753 m)   Wt 234 lb 6.4 oz (106.3 kg)   LMP 07/27/2023 (Approximate)   SpO2 95%   BMI 34.61 kg/m   Vitals:    08/06/23 1012  BP: 122/80  Pulse: 77  Height: 5\' 9"  (1.753 m)  Weight: 234 lb 6.4 oz (106.3 kg)  SpO2: 95%  BMI (Calculated): 34.6    Physical Exam   Results for orders placed or performed in visit on 08/06/23  POCT CBG (Fasting - Glucose)  Result Value Ref Range   Glucose Fasting, POC 287 (A) 70 - 99 mg/dL  POCT Urinalysis Dipstick (81002)  Result Value Ref Range   Color, UA yellow    Clarity, UA clear    Glucose, UA Positive (A) Negative   Bilirubin, UA neg    Ketones, UA neg    Spec Grav, UA 1.020 1.010 - 1.025   Blood, UA neg    pH, UA 6.5 5.0 - 8.0   Protein, UA Negative Negative   Urobilinogen, UA 0.2 0.2 or 1.0 E.U./dL   Nitrite, UA neg    Leukocytes, UA Negative Negative   Appearance clear    Odor yes     Recent Results (from the past 2160 hour(s))  POC CREATINE & ALBUMIN,URINE     Status: None   Collection Time: 05/23/23 10:54 AM  Result Value Ref  Range   Microalbumin Ur, POC 80 mg/L   Creatinine, POC 200 mg/dL   Albumin/Creatinine Ratio, Urine, POC 30-300   Surgical pathology     Status: None   Collection Time: 07/02/23 12:00 AM  Result Value Ref Range   SURGICAL PATHOLOGY      SURGICAL PATHOLOGY [facility] [address], [address] [place], [place] [address] Telephone [phone] or [phone] Fax [phone]  REPORT OF SURGICAL PATHOLOGY   Accession #: [id number] Patient Name: Kiara, Woodard Visit # : [id number]  MRN: [id number] Physician: Olive Bass DOB/Age Feb 14, 1976 (Age: 31) Gender: F Collected Date: 07/02/2023 Received Date: 07/02/2023  FINAL DIAGNOSIS       1. 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  Clinical and radiological correlation      recommended.      DATE SIGNED OUT:  07/03/2023 ELECTRONIC SIGNATURE : Corey Harold M.D., Dossie Arbour., Pathologist, Electronic Signature  MICROSCOPIC DESCRIPTION  CASE COMMENTS STAINS USED IN DIAGNOSIS: H&E H&E    CLINICAL HISTORY  SPEC IMEN(S) OBTAINED 1. Salivary gland, biopsy, Left Parotid  SPECIMEN COMMENTS: 1. Lesion core needle biopsy 18GA x3 cores SPECIMEN CLINICAL INFORMATION:    Gross Description 1. Received in formalin, labeled "LT parotid", are multiple tan-gray soft tissue cores, 0.2 to 1.1 cm in length, submitted entirely in blocks 1A-1B.      SMB      07/02/2023        Report signed out from the following location(s) [place] [address], [place] [address] CLIA #: [id number]  [facility] [address] [place], [place] [address] CLIA #: [id number]   CBC     Status: Abnormal   Collection Time: 07/03/23  9:19 PM  Result Value Ref Range   WBC 11.0 (H) 4.0 - 10.5 K/uL   RBC 4.34 3.87 - 5.11 MIL/uL   Hemoglobin 12.1 12.0 - 15.0 g/dL   HCT 09.3 23.5 - 57.3 %   MCV 87.6 80.0 - 100.0 fL   MCH 27.9 26.0 - 34.0 pg   MCHC 31.8 30.0 - 36.0 g/dL   RDW 22.0 25.4 - 27.0 %   Platelets 351 150 - 400 K/uL   nRBC 0.0 0.0 - 0.2 %    Comment: Performed at [facility], [address] [place], [place] [address]  Comprehensive metabolic panel     Status: Abnormal   Collection Time: 07/03/23  9:19 PM  Result Value Ref Range   Sodium 131 (L) 135 - 145 mmol/L   Potassium 3.9 3.5 - 5.1 mmol/L   Chloride 99 98 - 111 mmol/L   CO2 24 22 - 32 mmol/L   Glucose, Bld 187 (H) 70 - 99 mg/dL    Comment: Glucose reference range applies only to samples taken after fasting for at least 8 hours.   BUN 9 6 - 20 mg/dL   Creatinine, Ser 2.83 0.44 - 1.00 mg/dL   Calcium 8.6 (L) 8.9 - 10.3 mg/dL   Total Protein 7.3 6.5 - 8.1 g/dL   Albumin 3.8 3.5 - 5.0 g/dL   AST 13 (L) 15 - 41 U/L   ALT 14 0 - 44 U/L  Alkaline Phosphatase 91 38 - 126 U/L   Total Bilirubin  0.3 0.3 - 1.2 mg/dL   GFR, Estimated >16 >10 mL/min    Comment: (NOTE) Calculated using the CKD-EPI Creatinine Equation (2021)    Anion gap 8 5 - 15    Comment: Performed at [facility], [address] [place], [place] [address]  Lipase, blood     Status: None   Collection Time: 07/03/23  9:19 PM  Result Value Ref Range   Lipase 37 11 - 51 U/L    Comment: Performed at [facility], [address] [place], [place] [address]  Urinalysis, Routine w reflex microscopic -Urine, Clean Catch     Status: Abnormal   Collection Time: 07/04/23 12:24 AM  Result Value Ref Range   Color, Urine YELLOW (A) YELLOW   APPearance CLEAR (A) CLEAR   Specific Gravity, Urine 1.008 1.005 - 1.030   pH 6.0 5.0 - 8.0   Glucose, UA NEGATIVE NEGATIVE mg/dL   Hgb urine dipstick NEGATIVE NEGATIVE   Bilirubin Urine NEGATIVE NEGATIVE   Ketones, ur 5 (A) NEGATIVE mg/dL   Protein, ur NEGATIVE NEGATIVE mg/dL   Nitrite NEGATIVE NEGATIVE   Leukocytes,Ua NEGATIVE NEGATIVE    Comment: Performed at [facility], [address] [place], [place] [address]  POC Urine Pregnancy, ED     Status: None   Collection Time: 07/04/23 12:27 AM  Result Value Ref Range   Preg Test, Ur Negative Negative  POCT CBG (Fasting - Glucose)     Status: Abnormal   Collection Time: 08/06/23 10:18 AM  Result Value Ref Range   Glucose Fasting, POC 287 (A) 70 - 99 mg/dL  POCT Urinalysis Dipstick (82956)     Status: Abnormal   Collection Time: 08/06/23 10:54 AM  Result Value Ref Range   Color, UA yellow    Clarity, UA clear    Glucose, UA Positive (A) Negative   Bilirubin, UA neg    Ketones, UA neg    Spec Grav, UA 1.020 1.010 - 1.025   Blood, UA neg    pH, UA 6.5 5.0 - 8.0   Protein, UA Negative Negative   Urobilinogen, UA 0.2 0.2 or 1.0 E.U./dL   Nitrite, UA neg    Leukocytes, UA Negative Negative   Appearance clear    Odor yes        Assessment & Plan:   Problem List Items Addressed This Visit        Active Problems   Hypothyroidism (acquired)    Sending new referral to Endocrine, so patient can re-establish care.  Given new insurance, may need to find a new provider.   Recheck at follow up.        Type 2 diabetes mellitus with hyperglycemia, without long-term current use of insulin (HCC) - Primary    Checking labs today. Will call pt. With results  Given that insurance has changed, will send new RX for Ozempic for her. Sample given in office today.  Will adjust meds if needed based on labs.       Relevant Medications   Semaglutide,0.25 or 0.5MG /DOS, 2 MG/3ML SOPN   Other Relevant Orders   POCT CBG (Fasting - Glucose) (Completed)   Other Visit Diagnoses     Dysuria       UA in office today abnormal.  Sending for UA/UC at [facility].  Will call with results and will treat as appropriate.   Relevant Orders   POCT Urinalysis Dipstick (81002) (  Completed)   Urinalysis, Routine w reflex microscopic   Urine Culture   Acute vaginitis       sending Nuswab today.  Will call with results and treat as appropriate.   Relevant Orders   NuSwab VG+, HSV       Return in about 1 month (around 09/05/2023).   Total time spent: 30 minutes  Miki Kins, FNP  08/06/2023   This document may have been prepared by [facility] Voice Recognition software and as such may include unintentional dictation errors.

## 2023-08-06 NOTE — Assessment & Plan Note (Addendum)
Sending new referral to Endocrine, so patient can re-establish care.  Given new insurance, may need to find a new provider.   Recheck at follow up.

## 2023-08-06 NOTE — Assessment & Plan Note (Signed)
Checking labs today. Will call pt. With results  Given that insurance has changed, will send new RX for Ozempic for her. Sample given in office today.  Will adjust meds if needed based on labs.

## 2023-08-07 LAB — URINALYSIS, ROUTINE W REFLEX MICROSCOPIC
Bilirubin, UA: NEGATIVE
Ketones, UA: NEGATIVE
Leukocytes,UA: NEGATIVE
Nitrite, UA: NEGATIVE
RBC, UA: NEGATIVE
Specific Gravity, UA: >=1.03 — AB (ref 1.005–1.030)
Urobilinogen, Ur: 0.2 mg/dL (ref 0.2–1.0)
pH, UA: 6.5 (ref 5.0–7.5)

## 2023-08-09 LAB — NUSWAB VG+, HSV
Atopobium vaginae: HIGH {score} — AB
BVAB 2: HIGH {score} — AB
Candida albicans, NAA: NEGATIVE
Candida glabrata, NAA: NEGATIVE
Chlamydia trachomatis, NAA: NEGATIVE
HSV 1 NAA: NEGATIVE
HSV 2 NAA: NEGATIVE
Megasphaera 1: HIGH {score} — AB
Neisseria gonorrhoeae, NAA: POSITIVE — AB
Trich vag by NAA: NEGATIVE

## 2023-08-09 LAB — URINE CULTURE

## 2023-08-10 ENCOUNTER — Encounter: Payer: Self-pay | Admitting: Family

## 2023-08-10 MED ORDER — CEFIXIME 400 MG PO CAPS: 800.0000 mg | ORAL_CAPSULE | Freq: Once | ORAL | 0 refills | Status: AC

## 2023-08-10 MED ORDER — METRONIDAZOLE 500 MG PO TABS: 500.0000 mg | ORAL_TABLET | Freq: Two times a day (BID) | ORAL | 0 refills | Status: AC

## 2023-08-10 NOTE — Addendum Note (Signed)
Addended by: Grayling Congress on: 08/10/2023 02:23 PM   Modules accepted: Orders

## 2023-08-13 ENCOUNTER — Encounter: Payer: Self-pay | Admitting: Otolaryngology

## 2023-08-13 ENCOUNTER — Encounter
Admission: RE | Admit: 2023-08-13 | Discharge: 2023-08-13 | Disposition: A | Discharge status: Home / Self Care | Payer: Medicaid Other | Source: Ambulatory Visit | Attending: Otolaryngology | Admitting: Otolaryngology

## 2023-08-13 ENCOUNTER — Other Ambulatory Visit: Payer: Self-pay

## 2023-08-13 DIAGNOSIS — Z01812 Encounter for preprocedural laboratory examination: Secondary | ICD-10-CM

## 2023-08-13 DIAGNOSIS — I10 Essential (primary) hypertension: Secondary | ICD-10-CM

## 2023-08-13 DIAGNOSIS — E1165 Type 2 diabetes mellitus with hyperglycemia: Secondary | ICD-10-CM

## 2023-08-13 DIAGNOSIS — D110 Benign neoplasm of parotid gland: Secondary | ICD-10-CM

## 2023-08-13 HISTORY — DX: Essential (primary) hypertension: I10

## 2023-08-13 NOTE — Patient Instructions (Addendum)
Your procedure is scheduled on:08/22/2023 Wednesday Report to the Registration Desk on the 1st floor of the [facility]. To find out your arrival time, please call [phone] between 1PM - 3PM on: Tuesday, 08/21/2023  If your arrival time is 6:00 am, do not arrive before that time as the [facility] entrance doors do not open until 6:00 am.  REMEMBER: Instructions that are not followed completely may result in serious medical risk, up to and including death; or upon the discretion of your surgeon and anesthesiologist your surgery may need to be rescheduled.  Do not eat food after midnight the night before surgery.  No gum chewing or hard candies.    One week prior to surgery: Stop Anti-inflammatories (NSAIDS) such as Advil, Aleve, Ibuprofen, Motrin, Naproxen, Naprosyn and Aspirin based products such as Excedrin, Goody's Powder, BC Powder. Stop ANY OVER THE COUNTER supplements until after surgery.  You may however, continue to take Tylenol if needed for pain up until the day of surgery.  Hold Semaglutide,0.25 or 0.5MG /DOS, 2 MG/3ML SOPN seven days prior to surgery. Last dose should be Nov 26  **Follow guidelines for insulin and diabetes medications.**      Continue taking all of your other prescription medications up until the day of surgery.  ON THE DAY OF SURGERY ONLY TAKE THESE MEDICATIONS WITH SIPS OF WATER:  DULoxetine (CYMBALTA) levothyroxine (SYNTHROID) liothyronine (CYTOMEL) pregabalin (LYRICA) 5.   tramadol    No Alcohol for 24 hours before or after surgery.  No Smoking including e-cigarettes for 24 hours before surgery.  No chewable tobacco products for at least 6 hours before surgery.  No nicotine patches on the day of surgery.  Do not use any "recreational" drugs for at least a week (preferably 2 weeks) before your surgery.  Please be advised that the combination of cocaine and anesthesia may have negative outcomes, up to and including death. If you test  positive for cocaine, your surgery will be cancelled.  On the morning of surgery brush your teeth with toothpaste and water, you may rinse your mouth with mouthwash if you wish. Do not swallow any toothpaste or mouthwash.  Please shower on day of surgery.   Do not wear jewelry, make-up, hairpins, clips or nail polish.  For welded (permanent) jewelry: bracelets, anklets, waist bands, etc.  Please have this removed prior to surgery.  If it is not removed, there is a chance that hospital personnel will need to cut it off on the day of surgery.  Do not wear lotions, powders, or perfumes.   Do not shave body hair from the neck down 48 hours before surgery.  Contact lenses, hearing aids and dentures may not be worn into surgery.  Do not bring valuables to the hospital. [facility] is not responsible for any missing/lost belongings or valuables.    Notify your doctor if there is any change in your medical condition (cold, fever, infection).  Wear comfortable clothing (specific to your surgery type) to the hospital.  After surgery, you can help prevent lung complications by doing breathing exercises.  Take deep breaths and cough every 1-2 hours. Your doctor may order a device called an Incentive Spirometer to help you take deep breaths.  If you are being admitted to the hospital overnight, leave your suitcase in the car. After surgery it may be brought to your room.  If you are being discharged the day of surgery, you will not be allowed to drive home. You will need a responsible  individual to drive you home and stay with you for 24 hours after surgery.   Please call the Pre-admissions Testing Dept. at [phone] if you have any questions about these instructions.  Surgery Visitation Policy:  Patients having surgery or a procedure may have two visitors.  Children under the age of 71 must have an adult with them who is not the patient.  Inpatient Visitation:    Visiting hours are  7 a.m. to 8 p.m. Up to four visitors are allowed at one time in a patient room. The visitors may rotate out with other people during the day.  One visitor age 17 or older may stay with the patient overnight and must be in the room by 8 p.m.

## 2023-08-14 ENCOUNTER — Encounter
Admission: RE | Admit: 2023-08-14 | Discharge: 2023-08-14 | Disposition: A | Discharge status: Home / Self Care | Payer: Medicaid Other | Source: Ambulatory Visit | Attending: Otolaryngology | Admitting: Otolaryngology

## 2023-08-14 DIAGNOSIS — Z01810 Encounter for preprocedural cardiovascular examination: Secondary | ICD-10-CM | POA: Diagnosis not present

## 2023-08-14 DIAGNOSIS — I10 Essential (primary) hypertension: Secondary | ICD-10-CM | POA: Insufficient documentation

## 2023-08-14 DIAGNOSIS — Z01812 Encounter for preprocedural laboratory examination: Secondary | ICD-10-CM

## 2023-08-15 NOTE — Progress Notes (Signed)
  Perioperative Services Pre-Admission/Anesthesia Testing    Date: 08/15/23  Name: Kiara Woodard MRN:   [id number]  Re: GLP-1 clearance and provider recommendations   Planned Surgical Procedure(s):    Case: [id number] Date/Time: 08/22/23 3875   Procedures:      SUPERFICIAL PAROTIDECTOMY (Left)     CONTINUOUS NERVE MONITORING (Left)   Anesthesia type: General   Pre-op diagnosis: Neoplasm uncertain behavior, Parotid gland   Location: [facility] OR ROOM 09 / [facility]   Surgeons: Geanie Logan, MD      Clinical Notes:  Patient is scheduled for the above procedure with the indicated provider/surgeon. In review of her medication reconciliation it was noted that patient is on a prescribed GLP-1 medication. Per guidelines issued by the American Society of Anesthesiologists (ASA), it is recommended that these medications be held for 7 days prior to the patient undergoing any type of elective surgical procedure. The patient is taking the following GLP-1 medication:  [x]  SEMAGLUTIDE   []  EXENATIDE  []  LIRAGLUTIDE   []  LIXISENATIDE  []  DULAGLUTIDE     []  TIRZEPATIDE (GLP-1/GIP)  Reached out to prescribing provider Talbert Forest, NP-C) to make them aware of the guidelines from anesthesia. Given that this patient takes the prescribed GLP-1 medication for her  diabetes diagnosis, rather than for weight loss, recommendations from the prescribing provider were solicited. Prescribing provider made aware of the following so that informed decision/POC can be developed for this patient that may be taking medications belonging to these drug classes:  Oral GLP-1 medications will be held 1 day prior to surgery.  Injectable GLP-1 medications will be held 7 days prior to surgery.  Metformin is routinely held 48 hours prior to surgery due to renal concerns, potential need for contrasted imaging perioperatively, and the potential for tissue hypoxia leading to drug induced lactic acidosis.  All  SGLT2i medications are held 72 hours prior to surgery as they can be associated with the increased potential for developing euglycemic diabetic ketoacidosis (EDKA).   Impression and Plan:  Kiara Woodard is on a prescribed GLP-1 medication, which induces the known side effect of decreased gastric emptying. Efforts are bring made to mitigate the risk of perioperative hyperglycemic events, as elevated blood glucose levels have been found to contribute to intra/postoperative complications. Additionally, hyperglycemic extremes can potentially necessitate the postponing of a patient's elective case in order to better optimize perioperative glycemic control, again with the aforementioned guidelines in place. With this in mind, recommendations have been sought from the prescribing provider, who has cleared patient to proceed with holding the prescribed GLP-1 as per the guidelines from the ASA.   Provider recommending: no further recommendations received from the prescribing provider.  Copy of signed clearance and recommendations placed on patient's chart for inclusion in their medical record and for review by the surgical/anesthetic team on the day of her procedure.   Quentin Mulling, MSN, APRN, FNP-C, CEN [facility]  Perioperative Services Nurse Practitioner Phone: [phone] Fax: [phone] 08/15/23 4:12 PM  NOTE: This note has been prepared using Dragon dictation software. Despite my best ability to proofread, there is always the potential that unintentional transcriptional errors may still occur from this process.

## 2023-08-21 MED ORDER — CHLORHEXIDINE GLUCONATE 0.12 % MT SOLN
15.0000 mL | Freq: Once | OROMUCOSAL | Status: AC
  Administered 2023-08-22: 15 mL via OROMUCOSAL

## 2023-08-21 MED ORDER — ORAL CARE MOUTH RINSE: 15.0000 mL | Freq: Once | OROMUCOSAL | Status: AC

## 2023-08-21 MED ORDER — SODIUM CHLORIDE 0.9% FLUSH: 10.0000 mL | Freq: Two times a day (BID) | INTRAVENOUS | Status: DC

## 2023-08-22 ENCOUNTER — Other Ambulatory Visit: Payer: Self-pay

## 2023-08-22 ENCOUNTER — Encounter
Admission: RE | Disposition: A | Discharge status: Home / Self Care | Payer: Self-pay | Source: Home / Self Care | Attending: Otolaryngology

## 2023-08-22 ENCOUNTER — Ambulatory Visit
Admission: RE | Admit: 2023-08-22 | Discharge: 2023-08-22 | Disposition: A | Discharge status: Home / Self Care | Payer: Medicaid Other | Attending: Otolaryngology | Admitting: Otolaryngology

## 2023-08-22 ENCOUNTER — Encounter: Payer: Self-pay | Admitting: Otolaryngology

## 2023-08-22 ENCOUNTER — Ambulatory Visit: Payer: Medicaid Other | Admitting: Urgent Care

## 2023-08-22 DIAGNOSIS — E1165 Type 2 diabetes mellitus with hyperglycemia: Secondary | ICD-10-CM | POA: Insufficient documentation

## 2023-08-22 DIAGNOSIS — I10 Essential (primary) hypertension: Secondary | ICD-10-CM | POA: Insufficient documentation

## 2023-08-22 DIAGNOSIS — Z87891 Personal history of nicotine dependence: Secondary | ICD-10-CM | POA: Diagnosis not present

## 2023-08-22 DIAGNOSIS — E039 Hypothyroidism, unspecified: Secondary | ICD-10-CM | POA: Diagnosis not present

## 2023-08-22 DIAGNOSIS — Z01812 Encounter for preprocedural laboratory examination: Secondary | ICD-10-CM

## 2023-08-22 DIAGNOSIS — D110 Benign neoplasm of parotid gland: Secondary | ICD-10-CM | POA: Insufficient documentation

## 2023-08-22 DIAGNOSIS — G4730 Sleep apnea, unspecified: Secondary | ICD-10-CM | POA: Insufficient documentation

## 2023-08-22 HISTORY — DX: Neoplasm of uncertain behavior of the parotid salivary glands: D37.030

## 2023-08-22 HISTORY — PX: CONTINUOUS NERVE MONITORING: SHX6650

## 2023-08-22 HISTORY — PX: PAROTIDECTOMY: SHX2163

## 2023-08-22 LAB — GLUCOSE, CAPILLARY
Glucose-Capillary: 214 mg/dL — ABNORMAL HIGH (ref 70–99)
Glucose-Capillary: 267 mg/dL — ABNORMAL HIGH (ref 70–99)

## 2023-08-22 LAB — POCT PREGNANCY, URINE: Preg Test, Ur: NEGATIVE

## 2023-08-22 SURGERY — EXCISION, PAROTID GLAND
Anesthesia: General | Site: Face | Laterality: Left

## 2023-08-22 MED ORDER — INSULIN ASPART 100 UNIT/ML IJ SOLN
8.0000 [IU] | Freq: Once | INTRAMUSCULAR | Status: AC
  Administered 2023-08-22: 8 [IU] via SUBCUTANEOUS

## 2023-08-22 MED ORDER — FENTANYL CITRATE (PF) 100 MCG/2ML IJ SOLN
INTRAMUSCULAR | Status: AC
  Filled 2023-08-22: qty 2

## 2023-08-22 MED ORDER — DEXAMETHASONE SODIUM PHOSPHATE 10 MG/ML IJ SOLN
INTRAMUSCULAR | Status: DC | PRN
  Administered 2023-08-22: 10 mg via INTRAVENOUS

## 2023-08-22 MED ORDER — SUCCINYLCHOLINE CHLORIDE 200 MG/10ML IV SOSY
PREFILLED_SYRINGE | INTRAVENOUS | Status: DC | PRN
  Administered 2023-08-22: 100 mg via INTRAVENOUS

## 2023-08-22 MED ORDER — PHENYLEPHRINE 80 MCG/ML (10ML) SYRINGE FOR IV PUSH (FOR BLOOD PRESSURE SUPPORT)
PREFILLED_SYRINGE | INTRAVENOUS | Status: AC
  Filled 2023-08-22: qty 10

## 2023-08-22 MED ORDER — DEXAMETHASONE SODIUM PHOSPHATE 10 MG/ML IJ SOLN
INTRAMUSCULAR | Status: AC
  Filled 2023-08-22: qty 1

## 2023-08-22 MED ORDER — LIDOCAINE HCL (CARDIAC) PF 100 MG/5ML IV SOSY
PREFILLED_SYRINGE | INTRAVENOUS | Status: DC | PRN
  Administered 2023-08-22: 80 mg via INTRAVENOUS

## 2023-08-22 MED ORDER — LIDOCAINE-EPINEPHRINE (PF) 1 %-1:200000 IJ SOLN
INTRAMUSCULAR | Status: AC
  Filled 2023-08-22: qty 30

## 2023-08-22 MED ORDER — FENTANYL CITRATE (PF) 100 MCG/2ML IJ SOLN
25.0000 ug | INTRAMUSCULAR | Status: DC | PRN
  Administered 2023-08-22 (×2): 50 ug via INTRAVENOUS

## 2023-08-22 MED ORDER — HYDROCODONE-ACETAMINOPHEN 5-325 MG PO TABS: 1.0000 | ORAL_TABLET | Freq: Four times a day (QID) | ORAL | 0 refills | Status: DC | PRN

## 2023-08-22 MED ORDER — MIDAZOLAM HCL 2 MG/2ML IJ SOLN
INTRAMUSCULAR | Status: DC | PRN
  Administered 2023-08-22: 2 mg via INTRAVENOUS

## 2023-08-22 MED ORDER — ONDANSETRON HCL 4 MG/2ML IJ SOLN
INTRAMUSCULAR | Status: AC
  Filled 2023-08-22: qty 2

## 2023-08-22 MED ORDER — PHENYLEPHRINE 80 MCG/ML (10ML) SYRINGE FOR IV PUSH (FOR BLOOD PRESSURE SUPPORT)
PREFILLED_SYRINGE | INTRAVENOUS | Status: DC | PRN
  Administered 2023-08-22 (×4): 80 ug via INTRAVENOUS
  Administered 2023-08-22: 160 ug via INTRAVENOUS
  Administered 2023-08-22 (×3): 80 ug via INTRAVENOUS

## 2023-08-22 MED ORDER — FENTANYL CITRATE (PF) 100 MCG/2ML IJ SOLN
INTRAMUSCULAR | Status: DC | PRN
  Administered 2023-08-22 (×2): 50 ug via INTRAVENOUS

## 2023-08-22 MED ORDER — SODIUM CHLORIDE (PF) 0.9 % IJ SOLN
INTRAMUSCULAR | Status: AC
  Filled 2023-08-22: qty 20

## 2023-08-22 MED ORDER — 0.9 % SODIUM CHLORIDE (POUR BTL) OPTIME
TOPICAL | Status: DC | PRN
  Administered 2023-08-22: 120 mL

## 2023-08-22 MED ORDER — PROPOFOL 10 MG/ML IV BOLUS
INTRAVENOUS | Status: AC
  Filled 2023-08-22: qty 20

## 2023-08-22 MED ORDER — TRIPLE ANTIBIOTIC 3.5-400-5000 EX OINT
TOPICAL_OINTMENT | CUTANEOUS | Status: AC
  Filled 2023-08-22: qty 1

## 2023-08-22 MED ORDER — MIDAZOLAM HCL 2 MG/2ML IJ SOLN
INTRAMUSCULAR | Status: AC
Start: 2023-08-22 — End: ?
  Filled 2023-08-22: qty 2

## 2023-08-22 MED ORDER — OXYCODONE HCL 5 MG PO TABS
5.0000 mg | ORAL_TABLET | Freq: Once | ORAL | Status: AC | PRN
  Administered 2023-08-22: 5 mg via ORAL

## 2023-08-22 MED ORDER — INSULIN ASPART 100 UNIT/ML IJ SOLN
INTRAMUSCULAR | Status: AC
Start: 2023-08-22 — End: ?
  Filled 2023-08-22: qty 1

## 2023-08-22 MED ORDER — ONDANSETRON HCL 4 MG/2ML IJ SOLN
INTRAMUSCULAR | Status: DC | PRN
  Administered 2023-08-22: 4 mg via INTRAVENOUS

## 2023-08-22 MED ORDER — OXYCODONE HCL 5 MG PO TABS
ORAL_TABLET | ORAL | Status: AC
  Filled 2023-08-22: qty 1

## 2023-08-22 MED ORDER — REMIFENTANIL HCL 1 MG IV SOLR
INTRAVENOUS | Status: AC
  Filled 2023-08-22: qty 1000

## 2023-08-22 MED ORDER — REMIFENTANIL HCL 1 MG IV SOLR
INTRAVENOUS | Status: DC | PRN
Start: 2023-08-22 — End: 2023-08-22
  Administered 2023-08-22: .1 ug/kg/min via INTRAVENOUS

## 2023-08-22 MED ORDER — LIDOCAINE HCL (PF) 2 % IJ SOLN
INTRAMUSCULAR | Status: AC
  Filled 2023-08-22: qty 5

## 2023-08-22 MED ORDER — EPHEDRINE SULFATE-NACL 50-0.9 MG/10ML-% IV SOSY
PREFILLED_SYRINGE | INTRAVENOUS | Status: DC | PRN
  Administered 2023-08-22: 10 mg via INTRAVENOUS
  Administered 2023-08-22: 15 mg via INTRAVENOUS

## 2023-08-22 MED ORDER — DEXMEDETOMIDINE HCL IN NACL 80 MCG/20ML IV SOLN
INTRAVENOUS | Status: DC | PRN
  Administered 2023-08-22: 8 ug via INTRAVENOUS

## 2023-08-22 MED ORDER — CHLORHEXIDINE GLUCONATE 0.12 % MT SOLN
OROMUCOSAL | Status: AC
  Filled 2023-08-22: qty 15

## 2023-08-22 MED ORDER — OXYCODONE HCL 5 MG/5ML PO SOLN: 5.0000 mg | Freq: Once | ORAL | Status: AC | PRN

## 2023-08-22 MED ORDER — PROPOFOL 10 MG/ML IV BOLUS
INTRAVENOUS | Status: DC | PRN
  Administered 2023-08-22: 150 mg via INTRAVENOUS
  Administered 2023-08-22: 50 mg via INTRAVENOUS

## 2023-08-22 MED ORDER — LIDOCAINE-EPINEPHRINE (PF) 1 %-1:200000 IJ SOLN
INTRAMUSCULAR | Status: DC | PRN
  Administered 2023-08-22: 2 mL

## 2023-08-22 MED ORDER — SODIUM CHLORIDE 0.9 % IV SOLN: INTRAVENOUS | Status: DC | PRN

## 2023-08-22 MED ORDER — BACITRACIN ZINC 500 UNIT/GM EX OINT
TOPICAL_OINTMENT | CUTANEOUS | Status: AC
  Filled 2023-08-22: qty 28.35

## 2023-08-22 MED ORDER — SUCCINYLCHOLINE CHLORIDE 200 MG/10ML IV SOSY
PREFILLED_SYRINGE | INTRAVENOUS | Status: AC
  Filled 2023-08-22: qty 10

## 2023-08-22 MED ORDER — GLYCOPYRROLATE 0.2 MG/ML IJ SOLN
INTRAMUSCULAR | Status: DC | PRN
  Administered 2023-08-22: .2 mg via INTRAVENOUS

## 2023-08-22 MED ORDER — EPHEDRINE 5 MG/ML INJ
INTRAVENOUS | Status: AC
  Filled 2023-08-22: qty 5

## 2023-08-22 MED ORDER — BACITRACIN-NEOMYCIN-POLYMYXIN OINTMENT TUBE
TOPICAL_OINTMENT | CUTANEOUS | Status: DC | PRN
  Administered 2023-08-22: 1 via TOPICAL

## 2023-08-22 SURGICAL SUPPLY — 42 items
ADHESIVE MASTISOL STRL (MISCELLANEOUS) ×1 IMPLANT
ATTRACTOMAT 16X20 MAGNETIC DRP (DRAPES) ×1 IMPLANT
BLADE SURG 15 STRL LF DISP TIS (BLADE) ×1 IMPLANT
CORD BIP STRL DISP 12FT (MISCELLANEOUS) ×1 IMPLANT
COTTON BALL STERILE (GAUZE/BANDAGES/DRESSINGS) ×1
COTTON BALL STERILE 4 PK (GAUZE/BANDAGES/DRESSINGS) ×1 IMPLANT
DRAIN WOUND RND W/TROCAR (DRAIN) IMPLANT
DRSG TEGADERM 2-3/8X2-3/4 SM (GAUZE/BANDAGES/DRESSINGS) ×4 IMPLANT
DRSG TELFA 3X4 N-ADH STERILE (GAUZE/BANDAGES/DRESSINGS) ×1 IMPLANT
ELECT CAUTERY BLADE TIP 2.5 (TIP) ×1
ELECT EMG 20MM DUAL (MISCELLANEOUS) ×2
ELECT NEEDLE 20X.3 GREEN (MISCELLANEOUS) ×1
ELECT REM PT RETURN 9FT ADLT (ELECTROSURGICAL) ×1
ELECTRODE CAUTERY BLDE TIP 2.5 (TIP) ×1 IMPLANT
ELECTRODE EMG 20MM DUAL (MISCELLANEOUS) ×2 IMPLANT
ELECTRODE NDL 20X.3 GREEN (MISCELLANEOUS) ×1 IMPLANT
ELECTRODE NEEDLE 20X.3 GREEN (MISCELLANEOUS) ×1 IMPLANT
ELECTRODE REM PT RTRN 9FT ADLT (ELECTROSURGICAL) ×1 IMPLANT
EVACUATOR SILICONE 100CC (DRAIN) IMPLANT
FORCEPS JEWEL BIP 4-3/4 STR (INSTRUMENTS) ×1 IMPLANT
GAUZE 4X4 16PLY ~~LOC~~+RFID DBL (SPONGE) ×1 IMPLANT
GAUZE SPONGE 4X4 12PLY STRL (GAUZE/BANDAGES/DRESSINGS) ×1 IMPLANT
GLOVE BIO SURGEON STRL SZ7.5 (GLOVE) ×2 IMPLANT
GOWN STRL REUS W/ TWL LRG LVL3 (GOWN DISPOSABLE) ×3 IMPLANT
HOOK STAY BLUNT/RETRACTOR 5M (MISCELLANEOUS) IMPLANT
KIT TURNOVER KIT A (KITS) ×1 IMPLANT
LABEL OR SOLS (LABEL) ×1 IMPLANT
MANIFOLD NEPTUNE II (INSTRUMENTS) ×1 IMPLANT
MARKER SKIN DUAL TIP RULER LAB (MISCELLANEOUS) ×1 IMPLANT
NS IRRIG 500ML POUR BTL (IV SOLUTION) IMPLANT
PACK HEAD/NECK (MISCELLANEOUS) ×1 IMPLANT
PROBE MONO 100X0.75 ELECT 1.9M (MISCELLANEOUS) ×1 IMPLANT
SHEARS HARMONIC 9CM CVD (BLADE) ×1 IMPLANT
SOL PREP PVP 2OZ (MISCELLANEOUS) ×2
SOLUTION PREP PVP 2OZ (MISCELLANEOUS) ×1 IMPLANT
SPONGE KITTNER 5P (MISCELLANEOUS) ×2 IMPLANT
SUT ETHILON 4-0 FS2 18XMFL BLK (SUTURE) ×1
SUT PROLENE 5 0 PS 3 (SUTURE) IMPLANT
SUT SILK 3-0 18XBRD TIE 12 (SUTURE) IMPLANT
SUT VIC AB 4-0 RB1 18 (SUTURE) ×1 IMPLANT
SUTURE ETHLN 4-0 FS2 18XMF BLK (SUTURE) IMPLANT
TRAP FLUID SMOKE EVACUATOR (MISCELLANEOUS) ×1 IMPLANT

## 2023-08-22 NOTE — H&P (Signed)
History and physical reviewed and will be scanned in later. No change in medical status reported by the patient or family, appears stable for surgery. All questions regarding the procedure answered, and patient (or family if a child) expressed understanding of the procedure. ? ?Kiara Woodard Kiara Woodard ?@TODAY@ ?

## 2023-08-22 NOTE — Anesthesia Postprocedure Evaluation (Signed)
Anesthesia Post Note  Patient: GERAL WENSTROM  Procedure(s) Performed: SUPERFICIAL PAROTIDECTOMY (Left: Face) CONTINUOUS NERVE MONITORING (Left: Face)  Patient location during evaluation: PACU Anesthesia Type: General Level of consciousness: awake and alert Pain management: pain level controlled Vital Signs Assessment: post-procedure vital signs reviewed and stable Respiratory status: spontaneous breathing, nonlabored ventilation, respiratory function stable and patient connected to nasal cannula oxygen Cardiovascular status: blood pressure returned to baseline and stable Postop Assessment: no apparent nausea or vomiting Anesthetic complications: no  No notable events documented.   Last Vitals:  Vitals:   08/22/23 1210 08/22/23 1215  BP:  118/74  Pulse: 88 95  Resp: 12 17  Temp:    SpO2: 92% 94%    Last Pain:  Vitals:   08/22/23 1215  TempSrc:   PainSc: 4                  Stephanie Coup

## 2023-08-22 NOTE — Transfer of Care (Signed)
Immediate Anesthesia Transfer of Care Note  Patient: Kiara Woodard  Procedure(s) Performed: SUPERFICIAL PAROTIDECTOMY (Left: Face) CONTINUOUS NERVE MONITORING (Left: Face)  Patient Location: PACU  Anesthesia Type:General  Level of Consciousness: drowsy  Airway & Oxygen Therapy: Patient Spontanous Breathing and Patient connected to face mask oxygen  Post-op Assessment: Report given to RN and Post -op Vital signs reviewed and stable  Post vital signs: Reviewed and stable  Last Vitals:  Vitals Value Taken Time  BP 115/64 08/22/23 1136  Temp 37.4 C 08/22/23 1136  Pulse 92 08/22/23 1137  Resp 14 08/22/23 1137  SpO2 96 % 08/22/23 1137  Vitals shown include unfiled device data.  Last Pain:  Vitals:   08/22/23 0802  TempSrc: Oral  PainSc: 0-No pain         Complications: No notable events documented.

## 2023-08-22 NOTE — Anesthesia Preprocedure Evaluation (Signed)
Anesthesia Evaluation  Patient identified by MRN, date of birth, ID band Patient awake    Reviewed: Allergy & Precautions, NPO status , Patient's Chart, lab work & pertinent test results  History of Anesthesia Complications Negative for: history of anesthetic complications  Airway Mallampati: III  TM Distance: >3 FB Neck ROM: full    Dental  (+) Chipped, Dental Advidsory Given   Pulmonary neg shortness of breath, sleep apnea , neg COPD, former smoker   Pulmonary exam normal        Cardiovascular hypertension, On Medications negative cardio ROS Normal cardiovascular exam     Neuro/Psych negative neurological ROS  negative psych ROS   GI/Hepatic negative GI ROS, Neg liver ROS,,,  Endo/Other  diabetesHypothyroidism    Renal/GU      Musculoskeletal   Abdominal   Peds  Hematology negative hematology ROS (+)   Anesthesia Other Findings Past Medical History: 02/18/2020: Chronic fatigue 02/18/2020: Fibromyalgia No date: Hypertension 06/26/2019: Hypothyroidism (acquired) 02/12/2014: Lumbar radiculopathy No date: 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 No pertinent surgical history.  BMI    Body Mass Index: 34.70 kg/m      Reproductive/Obstetrics negative OB ROS                             Anesthesia Physical Anesthesia Plan  ASA: 2  Anesthesia Plan: General ETT and General   Post-op Pain Management:    Induction: Intravenous  PONV Risk Score and Plan: 3 and Ondansetron, Dexamethasone and Midazolam  Airway Management Planned: Oral ETT  Additional Equipment:   Intra-op Plan:   Post-operative Plan: Extubation in OR  Informed Consent: I have reviewed the patients History and Physical, chart, labs and discussed the procedure including the  risks, benefits and alternatives for the proposed anesthesia with the patient or authorized representative who has indicated his/her understanding and acceptance.     Dental Advisory Given  Plan Discussed with: Anesthesiologist, CRNA and Surgeon  Anesthesia Plan Comments: (Patient consented for risks of anesthesia including but not limited to:  - adverse reactions to medications - damage to eyes, teeth, lips or other oral mucosa - nerve damage due to positioning  - sore throat or hoarseness - Damage to heart, brain, nerves, lungs, other parts of body or loss of life  Patient voiced understanding and assent.)       Anesthesia Quick Evaluation

## 2023-08-22 NOTE — Anesthesia Procedure Notes (Signed)
Procedure Name: Intubation Date/Time: 08/22/2023 8:57 AM  Performed by: Mathews Argyle, CRNAPre-anesthesia Checklist: Patient identified, Patient being monitored, Timeout performed, Emergency Drugs available and Suction available Patient Re-evaluated:Patient Re-evaluated prior to induction Oxygen Delivery Method: Circle system utilized Preoxygenation: Pre-oxygenation with 100% oxygen Induction Type: IV induction Ventilation: Mask ventilation without difficulty Laryngoscope Size: 3 and McGrath Grade View: Grade I Tube type: Oral Tube size: 6.5 mm Number of attempts: 1 Airway Equipment and Method: Stylet and Video-laryngoscopy Placement Confirmation: ETT inserted through vocal cords under direct vision, positive ETCO2 and breath sounds checked- equal and bilateral Secured at: 20 cm Tube secured with: Tape Dental Injury: Teeth and Oropharynx as per pre-operative assessment

## 2023-08-22 NOTE — Op Note (Signed)
08/22/2023  11:29 AM    Kiara Woodard  [id number]   Pre-Op Diagnosis:  Neoplasm uncertain behavior, Left Parotid gland  Post-op Diagnosis: Neoplasm uncertain behavior, Left Parotid gland  Procedure:   Left Parotidectomy with facial nerve mobilization  Surgeon:  Sandi Mealy  Assistant: Reola Mosher  Anesthesia:  General endotracheal anesthesia  EBL:  Less than 25 cc  Complications:  None  Findings: Nearly 2 cm well circumscribed mass in the tail of the parotid, deep to the branches of the facial nerve  Procedure: The patient was taken to the Operating Room and placed in the supine position.  After induction of general endotracheal anesthesia, the patient was turned 90 degrees and placed on a shoulder roll. The skin was injected along the proposed incision with 1% lidocaine with epinephrine, 1:200,000. The facial nerve monitor electrodes were placed in the usual fashion at the lower lip and brow on the same side of the procedure. Proper functioning of the nerve monitor was assessed. The area was then prepped and draped in the usual sterile fashion.   A 15 blade was then used to incise the skin from just in front of the left tragus, curving below the earlobe, and curving into the neck along an upper neck crease. . The dissection was carried down to the subcutaneous tissues with the harmonic scalpel and through the platysma muscle, exposing the anterior belly of the sternocleidomastoid muscle. More superiorly the dissection proceeded along the tragal cartilage and down towards the mastoid tip. The dissection proceeded inferiorly to expose the digastric muscle. Next a skin flap was elevated just superficial to the fascia over the parotid gland, widely exposing the gland. Dissection then proceeded deeper, dissecting down to the region of the stylomastoid notch, dividing soft tissues with the Harmonic scalpel. The facial nerve was then identified at this point and confirmed with the nerve  stimulator. Dissection then proceeded along the nerve anteriorly, identifying the pes and then dissecting along the superior and inferior branches of the nerve, dividing parotid tissue above the nerve with the Harmonic scalpel. Intervening parotid tissue between branches was divided with the Harmonic scalpel. The mass was encountered and found to be deep to the marginal and buccal branches of the facial nerve, which required additional deep lobe dissection and mobilization of these branches for removal of the tumor. The tumor was removed along with associated parotid tissue superficial to the mass.   The wound was irrigated with saline and hemostasis obtained. A #10 TLS drain was placed through a separate stab incision in the skin posterior and inferior to the incision in the neck, and secured with a 4-0 nylon suture. The subcutaneous tissues were then closed with 4-0 Vicryl suture in an interrupted fashion. The skin was closed with 5-0 Prolene suture in a running locked stitch. Bacitracin ointment was applied to the wound.   The patient was then returned to the anesthesiologist for awakening, and was taken to the Recovery Room in stable condition.  Disposition:   PACU then discharge home  Plan: Observe in PACU then discharge home with drain in place. Follow-up tomorrow in office for drain removal. Ice to wound as needed for swelling. Take pain medications as prescribed.    Sandi Mealy 08/22/2023 11:29 AM

## 2023-08-23 ENCOUNTER — Encounter: Payer: Self-pay | Admitting: Otolaryngology

## 2023-08-24 LAB — SURGICAL PATHOLOGY

## 2023-10-04 ENCOUNTER — Telehealth: Payer: Self-pay | Admitting: Family

## 2023-10-04 NOTE — Telephone Encounter (Signed)
Patient called in needing a Rx for Ozempic 0.5 mg weekly sent to the pharmacy.  [facility]

## 2023-10-05 ENCOUNTER — Other Ambulatory Visit: Payer: Self-pay

## 2023-10-05 MED ORDER — SEMAGLUTIDE(0.25 OR 0.5MG/DOS) 2 MG/3ML ~~LOC~~ SOPN: 0.5000 mg | PEN_INJECTOR | SUBCUTANEOUS | 1 refills | Status: DC

## 2023-10-05 NOTE — Telephone Encounter (Signed)
Rx for ozempic sent in per [facility] verbal

## 2023-10-11 ENCOUNTER — Ambulatory Visit: Payer: Medicaid Other | Admitting: Family

## 2023-10-12 ENCOUNTER — Ambulatory Visit: Payer: Medicaid Other | Admitting: Family

## 2023-10-12 ENCOUNTER — Encounter: Payer: Self-pay | Admitting: Family

## 2023-10-12 VITALS — BP 130/90 | HR 88 | Ht 69.0 in | Wt 232.8 lb

## 2023-10-12 DIAGNOSIS — E1165 Type 2 diabetes mellitus with hyperglycemia: Secondary | ICD-10-CM

## 2023-10-12 DIAGNOSIS — F322 Major depressive disorder, single episode, severe without psychotic features: Secondary | ICD-10-CM

## 2023-10-12 DIAGNOSIS — Z0130 Encounter for examination of blood pressure without abnormal findings: Secondary | ICD-10-CM

## 2023-10-12 DIAGNOSIS — E039 Hypothyroidism, unspecified: Secondary | ICD-10-CM

## 2023-10-12 MED ORDER — PREGABALIN 150 MG PO CAPS: 150.0000 mg | ORAL_CAPSULE | Freq: Two times a day (BID) | ORAL | 3 refills | Status: DC

## 2023-10-12 MED ORDER — CEFIXIME 400 MG PO CAPS: 800.0000 mg | ORAL_CAPSULE | Freq: Once | ORAL | 0 refills | Status: AC

## 2023-10-13 ENCOUNTER — Encounter: Payer: Self-pay | Admitting: Family

## 2023-10-13 DIAGNOSIS — F322 Major depressive disorder, single episode, severe without psychotic features: Secondary | ICD-10-CM | POA: Insufficient documentation

## 2023-10-13 NOTE — Assessment & Plan Note (Signed)
Patient is already taking duloxetine.  She says that the increase in symptoms is due to current circumstances, does not think we need to adjust meds.   Reassess at follow up.

## 2023-10-13 NOTE — Assessment & Plan Note (Signed)
Referral sent to Dr. Elberta Fortis office.  Will defer to him for further changes.

## 2023-10-13 NOTE — Progress Notes (Signed)
Established Patient Office Visit  Subjective:  Patient ID: Kiara Woodard, female    DOB: 08-Dec-1975  Age: 48 y.o. MRN: [id number]  Chief Complaint  Patient presents with   Follow-up    Discuss lab results    Patient is here today for her 3 months follow up.  She has been feeling fairly well since last appointment.   She does have additional concerns to discuss today.  Asks if we can send a new referral to Dr. Gershon Crane at [facility] for her hypothyroidism and her diabetes.  She has been treated there previously, and would like to return.   She also says that she was unable to take the medications we sent her previously, they "got stuck" as she was trying to swallow them, and she had trouble getting them down, so she threw up.  As a result, she has not been treated.    Additionally, she has had an increase in stressors as well, due to some issues with setting up her mobile home.   Labs are not due today. She needs refills.   I have reviewed her active problem list, medication list, allergies, notes from last encounter, lab results for her appointment today.      No other concerns at this time.   Past Medical History:  Diagnosis Date   Chronic fatigue 02/18/2020   Fibromyalgia 02/18/2020   Hypertension    Hypothyroidism (acquired) 06/26/2019   Lumbar radiculopathy 02/12/2014   Neoplasm of uncertain behavior of parotid gland    Patellar subluxation, right, initial encounter 05/22/2023   Type 2 diabetes mellitus without complication, without long-term current use of insulin (HCC) 06/26/2019    Past Surgical History:  Procedure Laterality Date   CONTINUOUS NERVE MONITORING Left 08/22/2023   Procedure: CONTINUOUS NERVE MONITORING;  Surgeon: Geanie Logan, MD;  Location: [facility] ORS;  Service: ENT;  Laterality: Left;   PAROTIDECTOMY Left 08/22/2023   Procedure: SUPERFICIAL PAROTIDECTOMY;  Surgeon: Geanie Logan, MD;  Location: [facility] ORS;  Service: ENT;  Laterality: Left;     Social History   Socioeconomic History   Marital status: Married    Spouse name: Not on file   Number of children: Not on file   Years of education: Not on file   Highest education level: Not on file  Occupational History   Not on file  Tobacco Use   Smoking status: Former    Types: Cigarettes   Smokeless tobacco: Never  Substance and Sexual Activity   Alcohol use: Not Currently   Drug use: Never   Sexual activity: Yes  Other Topics Concern   Not on file  Social History Narrative   Lives at home with daughter.    Social Drivers of Corporate investment banker Strain: Not on file  Food Insecurity: Not on file  Transportation Needs: Not on file  Physical Activity: Not on file  Stress: Not on file  Social Connections: Not on file  Intimate Partner Violence: Not on file    Family History  Problem Relation Age of Onset   Breast cancer Maternal Aunt        mat great aunt    Allergies  Allergen Reactions   Penicillin G Itching    Fever, aches, itching    Review of Systems  All other systems reviewed and are negative.      Objective:   BP (!) 130/90   Pulse 88   Ht 5\' 9"  (1.753 m)   Wt 232 lb 12.8 oz (  105.6 kg)   SpO2 97%   BMI 34.38 kg/m   Vitals:   10/12/23 1535  BP: (!) 130/90  Pulse: 88  Height: 5\' 9"  (1.753 m)  Weight: 232 lb 12.8 oz (105.6 kg)  SpO2: 97%  BMI (Calculated): 34.36    Physical Exam Vitals and nursing note reviewed.  Constitutional:      Appearance: Normal appearance. She is normal weight.  HENT:     Head: Normocephalic.  Eyes:     Extraocular Movements: Extraocular movements intact.     Conjunctiva/sclera: Conjunctivae normal.     Pupils: Pupils are equal, round, and reactive to light.  Cardiovascular:     Rate and Rhythm: Normal rate.  Pulmonary:     Effort: Pulmonary effort is normal.  Neurological:     General: No focal deficit present.     Mental Status: She is alert and oriented to person, place, and time.  Mental status is at baseline.  Psychiatric:        Mood and Affect: Mood normal.        Behavior: Behavior normal.        Thought Content: Thought content normal.        Judgment: Judgment normal.      No results found for any visits on 10/12/23.  Recent Results (from the past 2160 hours)  POCT CBG (Fasting - Glucose)     Status: Abnormal   Collection Time: 08/06/23 10:18 AM  Result Value Ref Range   Glucose Fasting, POC 287 (A) 70 - 99 mg/dL  POCT Urinalysis Dipstick (82956)     Status: Abnormal   Collection Time: 08/06/23 10:54 AM  Result Value Ref Range   Color, UA yellow    Kiara, UA clear    Glucose, UA Positive (A) Negative   Bilirubin, UA neg    Ketones, UA neg    Spec Grav, UA 1.020 1.010 - 1.025   Blood, UA neg    pH, UA 6.5 5.0 - 8.0   Protein, UA Negative Negative   Urobilinogen, UA 0.2 0.2 or 1.0 E.U./dL   Nitrite, UA neg    Leukocytes, UA Negative Negative   Appearance clear    Odor yes   Urine Culture     Status: None   Collection Time: 08/06/23  2:44 PM   Specimen: Urine, Clean Catch   CU  Result Value Ref Range   Urine Culture, Routine Final report    Organism ID, Bacteria Comment     Comment: Mixed urogenital flora 10,000-25,000 colony forming units per mL   Urinalysis, Routine w reflex microscopic     Status: Abnormal   Collection Time: 08/06/23  2:46 PM  Result Value Ref Range   Specific Gravity, UA      >=1.030 (A) 1.005 - 1.030   pH, UA 6.5 5.0 - 7.5   Color, UA Yellow Yellow   Appearance Ur Clear Clear   Leukocytes,UA Negative Negative   Protein,UA Trace Negative/Trace   Glucose, UA 3+ (A) Negative   Ketones, UA Negative Negative   RBC, UA Negative Negative   Bilirubin, UA Negative Negative   Urobilinogen, Ur 0.2 0.2 - 1.0 mg/dL   Nitrite, UA Negative Negative   Microscopic Examination Comment     Comment: Microscopic not indicated and not performed.  NuSwab VG+, HSV     Status: Abnormal   Collection Time: 08/06/23  2:48 PM   Result Value Ref Range   Atopobium vaginae High - 2 (A) Score  BVAB 2 High - 2 (A) Score   Megasphaera 1 High - 2 (A) Score    Comment: Calculate total score by adding the 3 individual bacterial vaginosis (BV) marker scores together.  Total score is interpreted as follows: Total score 0-1: Indicates the absence of BV. Total score   2: Indeterminate for BV. Additional clinical                  data should be evaluated to establish a                  diagnosis. Total score 3-6: Indicates the presence of BV.    Candida albicans, NAA Negative Negative   Candida glabrata, NAA Negative Negative   Trich vag by NAA Negative Negative   Chlamydia trachomatis, NAA Negative Negative   Neisseria gonorrhoeae, NAA Positive (A) Negative   HSV 1 NAA Negative Negative   HSV 2 NAA Negative Negative  Surgical pathology     Status: None   Collection Time: 08/22/23 12:00 AM  Result Value Ref Range   SURGICAL PATHOLOGY      SURGICAL PATHOLOGY [facility] [address] [place], [place] [address] Telephone [phone] or [phone] Fax [phone]  REPORT OF SURGICAL PATHOLOGY   Accession #: [id number] Patient Name: ORLEAN, HOLTROP Visit # : [id number]  MRN: [id number] Physician: Geanie Logan DOB/Age 12/07/1975 (Age: 71) Gender: F Collected Date: 08/22/2023 Received Date: 08/22/2023  FINAL DIAGNOSIS       1. 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      - FOUR LYMPH NODES NEGATIVE FOR MALIGNANCY.      - NEGATIVE FOR HIGH-GRADE FEATURES AND MALIGNANCY.      - SEE NOTE.       Diagnosis Note : Sections demonstrate a cellular biphasic neoplasm with changes      consistent with prior FNA. The lesion extends to inked resection margins. A      definite micronodular pattern with peripheral hypercellular zones and central      hypocellularity are not identified. CK7 and p40 st ains  were performed and      confirm the presence of ductal elements throughout the lesion without uniform      myoepithelial areas. Stain controls worked appropriately. The pattern of      immunoreactivity and morphologic findings support the above diagnosis.      DATE SIGNED OUT: 08/24/2023 ELECTRONIC SIGNATURE : Oneita Kras Md, Delice Bison , Pathologist, Electronic Signature  MICROSCOPIC DESCRIPTION  CASE COMMENTS STAINS USED IN DIAGNOSIS: H&E H&E H&E H&E H&E H&E Universal Negative Control-DAB Stains used in diagnosis 1 CK-7, 1 P40, 1 CK-7, 1 P40 Some of these immunohistochemical stains may have been developed and the performance characteristics determined by [facility].  Some may not have been cleared or approved by the U.S. Food and Drug Administration.  The FDA has determined that such clearance or approval is not necessary.  This test is used for clinical purposes.  It should not be regarded as investigational or for research.  This laboratory is c ertified under the Clinical Laboratory Improvement Amendments of 1988 (CLIA-88) as qualified to perform high complexity clinical laboratory testing.    CLINICAL HISTORY  SPECIMEN(S) OBTAINED 1. Parotid gland, Left With Mass  SPECIMEN COMMENTS: SPECIMEN CLINICAL INFORMATION: 1. Neoplasm uncertain behavior, parotid gland    Gross Description 1. Specimen: "Left parotid  gland with associated parotid mass"      Portion A:      Size: 1.6 g (fresh); 1.7 x 1.5 x 1.3 cm      Orientation: Received unoriented; inked blue      External surface: Pink-tan, soft nodule with a smooth surface and a scant amount      of attached adipose tissue.      Cut surfaces: White-gray, soft, solid, and predominantly homogenous with mild      hemorrhagic discoloration; discrete necrosis is absent.      Portion B:      Size: 5.3 g (fresh); 4.3 x 2.3 x 1.3 cm      Orientation: Received unoriented; rough surface inked black, smooth surface       inked green      External surface: One surfa ce is tan-yellow and mildly roughened with scant      cautery (inked black); the opposing surface is pink-tan and predominantly smooth      and glistening (inked green).      Cut surfaces: There are two well-circumscribed, pink-tan nodules that are      0.3-0.4 cm in greatest dimenstion and are 0.1 cm of the closest surface (green),      consistent with a possible intraparenchymal lymph nodes. The remaining cut      surfaces are yellow-tan, soft, lobulated, and homogenous without hemorrhage,      necrosis, or discrete lesions.      Block summary:      1A-B: Portion A, entire      1C-F: Portion B, representative      1C-D: Intraparenchymal lymph node candidates, entire (one per block)      Collection time: 08/22/2023 at 1046      Time in formalin: 08/22/2023 at 1220      Cold ischemia time: 94 minutes      [facility] 08/22/2023        Report signed out from the following location(s) . [facility] [address], [place] [address] CLIA #: [id number] [address]  [facility] [address] [place], [place] [address] CLIA #: [id number]   Pregnancy, urine POC     Status: None   Collection Time: 08/22/23  8:09 AM  Result Value Ref Range   Preg Test, Ur NEGATIVE NEGATIVE    Comment:        THE SENSITIVITY OF THIS METHODOLOGY IS >24 mIU/mL   Glucose, capillary     Status: Abnormal   Collection Time: 08/22/23  8:13 AM  Result Value Ref Range   Glucose-Capillary 214 (H) 70 - 99 mg/dL    Comment: Glucose reference range applies only to samples taken after fasting for at least 8 hours.  Glucose, capillary     Status: Abnormal   Collection Time: 08/22/23 11:42 AM  Result Value Ref Range   Glucose-Capillary 267 (H) 70 - 99 mg/dL    Comment: Glucose reference range applies only to samples taken after fasting for at least 8 hours.       Assessment & Plan:   Problem List Items Addressed This Visit        Endocrine   Hypothyroidism (acquired) - Primary   Referral sent to Dr. Elberta Fortis office.  Will defer to him for further changes.       Relevant Orders   Ambulatory referral to Endocrinology   Type 2 diabetes mellitus with hyperglycemia, without long-term current use of insulin [facility])   Referral sent  to Dr. Elberta Fortis office.  Will defer to him for further changes.         Other   Moderately severe major depression (HCC)   Patient is already taking duloxetine.  She says that the increase in symptoms is due to current circumstances, does not think we need to adjust meds.   Reassess at follow up.       Return in about 3 months (around 01/10/2024).   Total time spent: 20 minutes  Miki Kins, FNP  10/12/2023   This document may have been prepared by [facility] Voice Recognition software and as such may include unintentional dictation errors.

## 2023-10-29 ENCOUNTER — Ambulatory Visit
Admission: EM | Admit: 2023-10-29 | Discharge: 2023-10-29 | Disposition: A | Discharge status: Home / Self Care | Payer: Medicaid Other | Attending: Emergency Medicine | Admitting: Emergency Medicine

## 2023-10-29 DIAGNOSIS — J069 Acute upper respiratory infection, unspecified: Secondary | ICD-10-CM | POA: Diagnosis not present

## 2023-10-29 DIAGNOSIS — L02212 Cutaneous abscess of back [any part, except buttock]: Secondary | ICD-10-CM | POA: Diagnosis not present

## 2023-10-29 LAB — POC COVID19/FLU A&B COMBO
Covid Antigen, POC: NEGATIVE
Influenza A Antigen, POC: NEGATIVE
Influenza B Antigen, POC: NEGATIVE

## 2023-10-29 MED ORDER — DOXYCYCLINE HYCLATE 100 MG PO CAPS: 100.0000 mg | ORAL_CAPSULE | Freq: Two times a day (BID) | ORAL | 0 refills | Status: DC

## 2023-10-29 NOTE — ED Provider Notes (Signed)
 Kiara Woodard    CSN: [id number] Arrival date & time: 10/29/23  1404      History   Chief Complaint Chief Complaint  Patient presents with   Fever   Cough   Abscess    HPI Kiara Woodard is a 48 y.o. female.   Patient presents for evaluation of subjective fever, chills, body aches, left-sided ear pain, sore throat, congestion, cough and intermittent headaches beginning 1 day ago.  Known sick contact with exposure to influenza.  Has attempted use of DayQuil.  Poor appetite but tolerating some food and fluids.  Denies presence of fever.  Patient concerned with the boil  present to the right upper aspect of the back beginning 2 to 3 days ago, progressively worsening, has become erythematous and pain.  Pain is heightened as cyst lies along the bra strap.  Denies redness or fever.  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   Chronic fatigue 02/18/2020   Chronic use of opiate drug for therapeutic purpose 02/18/2020   Fibromyalgia 02/18/2020   Positive ANA (antinuclear antibody) 02/18/2020   Hypothyroidism (acquired) 06/26/2019   Type 2 diabetes mellitus with hyperglycemia, without long-term current use of insulin  (HCC) 06/26/2019   Lumbar radiculopathy 02/12/2014    Past Surgical History:  Procedure Laterality Date   CONTINUOUS NERVE MONITORING Left 08/22/2023   Procedure: CONTINUOUS NERVE MONITORING;  Surgeon: Von Grumbling, MD;  Location: [facility] ORS;  Service: ENT;   Laterality: Left;   PAROTIDECTOMY Left 08/22/2023   Procedure: SUPERFICIAL PAROTIDECTOMY;  Surgeon: Von Grumbling, MD;  Location: [facility] OR[facility];  Service: ENT;  Laterality: Left;    OB History   No obstetric history on file.      Home Medications    Prior to Admission medications   Medication Sig Start Date End Date Taking? Authorizing Provider  doxycycline  (VIBRAMYCIN ) 100 MG capsule Take 1 capsule (100 mg total) by mouth 2 (two) times daily. 10/29/23  Yes Tove Wideman, Nerissa Bannister R, NP  DULoxetine (CYMBALTA) 60 MG capsule TAKE 1 CAPSULE BY MOUTH TWICE A DAY 07/13/23   Trenda Frisk, FNP  HYDROcodone -acetaminophen  (NORCO/VICODIN) 5-325 MG tablet Take 1-2 tablets by mouth every 6 (six) hours as needed for moderate pain (pain score 4-6). 08/22/23   Von Grumbling, MD  levothyroxine (SYNTHROID) 100 MCG tablet TAKE 1 TABLET BY MOTUH ONCE DAILY ON AN EMPTY STOMACH. WAIT 30 MINS BEFORE TAKING OTHER MEDS 06/29/23   Trenda Frisk, FNP  liothyronine (CYTOMEL) 5 MCG tablet Take 5 mcg by mouth 2 (two) times daily.    [provider]  losartan (COZAAR) 25 MG tablet TAKE 1 TABLET BY MOUTH EVERY DAY 05/17/23   Trenda Frisk, FNP  pregabalin  (LYRICA ) 150 MG capsule Take 1 capsule (150 mg total) by mouth 2 (two) times daily. 10/12/23   Trenda Frisk, FNP  Semaglutide ,0.25 or 0.5MG /DOS, 2 MG/3ML SOPN Inject 0.5 mg into the skin once a week. 10/05/23   Trenda Frisk, FNP  traMADol (ULTRAM) 50 MG tablet Take 100 mg by mouth  2 (two) times daily. 09/20/23   [provider]    Family History Family History  Problem Relation Age of Onset   Breast cancer Maternal Aunt        mat great aunt    Social History Social History   Tobacco Use   Smoking status: Former    Types: Cigarettes   Smokeless tobacco: Never  Substance Use Topics   Alcohol use: Not Currently   Drug use: Never     Allergies   Penicillin g   Review of Systems Review of Systems   Physical Exam Triage Vital  Signs ED Triage Vitals  Encounter Vitals Group     BP 10/29/23 1533 120/79     Systolic BP Percentile --      Diastolic BP Percentile --      Pulse Rate 10/29/23 1533 96     Resp 10/29/23 1533 18     Temp 10/29/23 1533 100 F (37.8 C)     Temp Source 10/29/23 1533 Oral     SpO2 10/29/23 1533 94 %     Weight --      Height --      Head Circumference --      Peak Flow --      Pain Score 10/29/23 1547 0     Pain Loc --      Pain Education --      Exclude from Growth Chart --    No data found.  Updated Vital Signs BP 120/79 (BP Location: Left Arm)   Pulse 96   Temp 100 F (37.8 C) (Oral)   Resp 18   LMP 09/28/2023 (Approximate)   SpO2 94%   Visual Acuity Right Eye Distance:   Left Eye Distance:   Bilateral Distance:    Right Eye Near:   Left Eye Near:    Bilateral Near:     Physical Exam Constitutional:      Appearance: Normal appearance.  HENT:     Head: Normocephalic.     Right Ear: Tympanic membrane, ear canal and external ear normal.     Left Ear: Tympanic membrane, ear canal and external ear normal.     Nose: Congestion present. No rhinorrhea.     Mouth/Throat:     Mouth: Mucous membranes are moist.     Pharynx: Oropharynx is clear.  Eyes:     Extraocular Movements: Extraocular movements intact.  Cardiovascular:     Rate and Rhythm: Normal rate and regular rhythm.     Pulses: Normal pulses.     Heart sounds: Normal heart sounds.  Pulmonary:     Effort: Pulmonary effort is normal.     Breath sounds: Normal breath sounds.  Skin:    Comments: 2 x 3 immature erythematous abscess present to the right thoracic region of the back  Neurological:     Mental Status: She is alert and oriented to person, place, and time. Mental status is at baseline.      UC Treatments / Results  Labs (all labs ordered are listed, but only abnormal results are displayed) Labs Reviewed  POC COVID19/FLU A&B COMBO - Normal    EKG   Radiology No results  found.  Procedures Procedures (including critical care time)  Medications Ordered in UC Medications - No data to display  Initial Impression / Assessment and Plan / UC Course  I have reviewed the triage vital signs and the nursing notes.  Pertinent labs & imaging results that were available during  my care of the patient were reviewed by me and considered in my medical decision making (see chart for details).  Viral URI with cough, abscess of back  Patient is in no signs of distress nor toxic appearing.  Vital signs are stable.  Low suspicion for pneumonia, pneumothorax or bronchitis and therefore will defer imaging.  Covid and Flu test negative.May use additional over-the-counter medications as needed for supportive care.  May follow-up with urgent care as needed if symptoms persist or worsen.  Due to firmness unable to completely, discussed with patient, prescribed doxycycline  recommended supportive care through over-the-counter analgesics and warm compresses with follow-up as needed if symptoms persist or worsen Final Clinical Impressions(s) / UC Diagnoses   Final diagnoses:  Viral URI with cough  Abscess of back     Discharge Instructions      Your symptoms today are most likely being caused by a virus and should steadily improve in time it can take up to 7 to 10 days before you truly start to see a turnaround however things will get better  Covid and flu negative    You can take Tylenol  and/or Ibuprofen as needed for fever reduction and pain relief.   For cough: honey 1/2 to 1 teaspoon (you can dilute the honey in water or another fluid).  You can also use guaifenesin and dextromethorphan for cough. You can use a humidifier for chest congestion and cough.  If you don't have a humidifier, you can sit in the bathroom with the hot shower running.      For sore throat: try warm salt water gargles, cepacol lozenges, throat spray, warm tea or water with lemon/honey, popsicles or  ice, or OTC cold relief medicine for throat discomfort.   For congestion: take a daily anti-histamine like Zyrtec, Claritin, and a oral decongestant, such as pseudoephedrine.  You can also use Flonase 1-2 sprays in each nostril daily.   It is important to stay hydrated: drink plenty of fluids (water, gatorade/powerade/pedialyte, juices, or teas) to keep your throat moisturized and help further relieve irritation/discomfort.  For treatment of the abscess to your back, at this time it is still firm but is unable to be drained.  Currently, able to see the head for make it should continue to progress injury naturally  Take this likely every morning and every evening for 7 days  Hold warm-hot compresses to affected area at least 4 times a day, this helps to facilitate draining, the more the better  Please return for evaluation for increased swelling, increased tenderness or pain, non healing site, non draining site, you begin to have fever or chills   We reviewed the etiology of recurrent abscesses of skin.  Skin abscesses are collections of pus within the dermis and deeper skin tissues. Skin abscesses manifest as painful, tender, fluctuant, and erythematous nodules, frequently surmounted by a pustule and surrounded by a rim of erythematous swelling.  Spontaneous drainage of purulent material may occur.  Fever can occur on occasion.    -Skin abscesses can develop in healthy individuals with no predisposing conditions other than skin or nasal carriage of Staphylococcus aureus.  Individuals in close contact with others who have active infection with skin abscesses are at increased risk which is likely to explain why twin brother has similar episodes.   In addition, any process leading to a breach in the skin barrier can also predispose to the development of a skin abscesses, such as atopic dermatitis.  ED Prescriptions     Medication Sig Dispense Auth. Provider   doxycycline  (VIBRAMYCIN ) 100 MG  capsule Take 1 capsule (100 mg total) by mouth 2 (two) times daily. 14 capsule Niomie Englert, Maybelle Spatz, NP      PDMP not reviewed this encounter.   Reena Canning, NP 10/29/23 [phone]

## 2023-10-29 NOTE — ED Triage Notes (Signed)
 Patient presents to UC for fever, cough, body aches, chills since yesterday. Took dayquil.

## 2023-10-29 NOTE — Discharge Instructions (Signed)
 Your symptoms today are most likely being caused by a virus and should steadily improve in time it can take up to 7 to 10 days before you truly start to see a turnaround however things will get better  Covid and flu negative    You can take Tylenol  and/or Ibuprofen as needed for fever reduction and pain relief.   For cough: honey 1/2 to 1 teaspoon (you can dilute the honey in water or another fluid).  You can also use guaifenesin and dextromethorphan for cough. You can use a humidifier for chest congestion and cough.  If you don't have a humidifier, you can sit in the bathroom with the hot shower running.      For sore throat: try warm salt water gargles, cepacol lozenges, throat spray, warm tea or water with lemon/honey, popsicles or ice, or OTC cold relief medicine for throat discomfort.   For congestion: take a daily anti-histamine like Zyrtec, Claritin, and a oral decongestant, such as pseudoephedrine.  You can also use Flonase 1-2 sprays in each nostril daily.   It is important to stay hydrated: drink plenty of fluids (water, gatorade/powerade/pedialyte, juices, or teas) to keep your throat moisturized and help further relieve irritation/discomfort.  For treatment of the abscess to your back, at this time it is still firm but is unable to be drained.  Currently, able to see the head for make it should continue to progress injury naturally  Take this likely every morning and every evening for 7 days  Hold warm-hot compresses to affected area at least 4 times a day, this helps to facilitate draining, the more the better  Please return for evaluation for increased swelling, increased tenderness or pain, non healing site, non draining site, you begin to have fever or chills   We reviewed the etiology of recurrent abscesses of skin.  Skin abscesses are collections of pus within the dermis and deeper skin tissues. Skin abscesses manifest as painful, tender, fluctuant, and erythematous nodules,  frequently surmounted by a pustule and surrounded by a rim of erythematous swelling.  Spontaneous drainage of purulent material may occur.  Fever can occur on occasion.    -Skin abscesses can develop in healthy individuals with no predisposing conditions other than skin or nasal carriage of Staphylococcus aureus.  Individuals in close contact with others who have active infection with skin abscesses are at increased risk which is likely to explain why twin brother has similar episodes.   In addition, any process leading to a breach in the skin barrier can also predispose to the development of a skin abscesses, such as atopic dermatitis.

## 2023-11-15 ENCOUNTER — Other Ambulatory Visit: Payer: Self-pay | Admitting: Family

## 2023-11-16 ENCOUNTER — Other Ambulatory Visit: Payer: Self-pay | Admitting: Family

## 2024-01-13 ENCOUNTER — Other Ambulatory Visit: Payer: Self-pay | Admitting: Family

## 2024-01-29 ENCOUNTER — Encounter: Payer: Self-pay | Admitting: Family

## 2024-01-29 ENCOUNTER — Ambulatory Visit: Admitting: Family

## 2024-01-29 VITALS — BP 134/84 | HR 93 | Ht 69.0 in | Wt 226.4 lb

## 2024-01-29 DIAGNOSIS — R5383 Other fatigue: Secondary | ICD-10-CM | POA: Diagnosis not present

## 2024-01-29 DIAGNOSIS — Z0130 Encounter for examination of blood pressure without abnormal findings: Secondary | ICD-10-CM

## 2024-01-29 DIAGNOSIS — M79645 Pain in left finger(s): Secondary | ICD-10-CM | POA: Diagnosis not present

## 2024-01-29 DIAGNOSIS — G8929 Other chronic pain: Secondary | ICD-10-CM | POA: Diagnosis not present

## 2024-01-29 DIAGNOSIS — M25511 Pain in right shoulder: Secondary | ICD-10-CM | POA: Diagnosis not present

## 2024-01-29 MED ORDER — DEXAMETHASONE 0.5 MG PO TABS: 1.0000 mg | ORAL_TABLET | Freq: Once | ORAL | 0 refills | Status: AC

## 2024-01-29 NOTE — Progress Notes (Signed)
 Established Patient Office Visit  Subjective:  Patient ID: Kiara Woodard, female    DOB: 11/05/1975  Age: 48 y.o. MRN: [id number]  Chief Complaint  Patient presents with   Follow-up    Patient is here today for her follow up.  She has been feeling fairly well since last appointment.   She does have additional concerns to discuss today.  Patient feels "like crap"  Gets a headache every night.  Spot on her skin on her chest, right chest wall. - has been there since last year.  Originally thought it was a pimple, but isn't.  Thumb pain - left thumb.  Having pain in right shoulder as well.  Feels like it's "getting stuck" Cushings? ACTH - Her Daughter's seizures were only controlled by this.   She asks if we can send a referral to Ortho for her, so she can have her thumb and her shoulder looked at.   Labs are not due today. She needs refills.   I have reviewed her active problem list, medication list, allergies, notes from last encounter, lab results for her appointment today.      No other concerns at this time.   Past Medical History:  Diagnosis Date   Chronic fatigue 02/18/2020   Fibromyalgia 02/18/2020   Hypertension    Hypothyroidism (acquired) 06/26/2019   Lumbar radiculopathy 02/12/2014   Neoplasm of uncertain behavior of parotid gland    Patellar subluxation, right, initial encounter 05/22/2023   Type 2 diabetes mellitus without complication, without long-term current use of insulin  (HCC) 06/26/2019    Past Surgical History:  Procedure Laterality Date   CONTINUOUS NERVE MONITORING Left 08/22/2023   Procedure: CONTINUOUS NERVE MONITORING;  Surgeon: Von Grumbling, MD;  Location: [facility];  Service: ENT;  Laterality: Left;   PAROTIDECTOMY Left 08/22/2023   Procedure: SUPERFICIAL PAROTIDECTOMY;  Surgeon: Von Grumbling, MD;  Location: [facility] OR[facility];  Service: ENT;  Laterality: Left;    Social History   Socioeconomic History   Marital status: Married    Spouse  name: Not on file   Number of children: Not on file   Years of education: Not on file   Highest education level: Not on file  Occupational History   Not on file  Tobacco Use   Smoking status: Former    Types: Cigarettes   Smokeless tobacco: Never  Substance and Sexual Activity   Alcohol use: Not Currently   Drug use: Never   Sexual activity: Yes  Other Topics Concern   Not on file  Social History Narrative   Lives at home with daughter.    Social Drivers of Corporate investment banker Strain: Not on file  Food Insecurity: Not on file  Transportation Needs: Not on file  Physical Activity: Not on file  Stress: Not on file  Social Connections: Not on file  Intimate Partner Violence: Not on file    Family History  Problem Relation Age of Onset   Breast cancer Maternal Aunt        mat great aunt    Allergies  Allergen Reactions   Penicillin G Itching    Fever, aches, itching    Review of Systems  Constitutional:  Positive for malaise/fatigue.  Musculoskeletal:  Positive for joint pain and myalgias.  Neurological:  Positive for weakness and headaches.  All other systems reviewed and are negative.      Objective:   BP 134/84   Pulse 93   Ht 5\' 9"  (1.753 m)  Wt 226 lb 6.4 oz (102.7 kg)   SpO2 96%   BMI 33.43 kg/m   Vitals:   01/29/24 1502  BP: 134/84  Pulse: 93  Height: 5\' 9"  (1.753 m)  Weight: 226 lb 6.4 oz (102.7 kg)  SpO2: 96%  BMI (Calculated): 33.42    Physical Exam Vitals and nursing note reviewed.  Constitutional:      Appearance: Normal appearance. She is obese. She is ill-appearing.  HENT:     Head: Normocephalic.  Eyes:     Extraocular Movements: Extraocular movements intact.     Conjunctiva/sclera: Conjunctivae normal.     Pupils: Pupils are equal, round, and reactive to light.  Cardiovascular:     Rate and Rhythm: Normal rate.  Pulmonary:     Effort: Pulmonary effort is normal.  Neurological:     General: No focal deficit  present.     Mental Status: She is alert and oriented to person, place, and time. Mental status is at baseline.  Psychiatric:        Attention and Perception: Attention and perception normal.        Mood and Affect: Mood is depressed. Affect is flat.        Speech: Speech normal.        Behavior: Behavior normal. Behavior is cooperative.        Thought Content: Thought content normal.        Cognition and Memory: Cognition and memory normal.        Judgment: Judgment normal.      No results found for any visits on 01/29/24.  No results found for this or any previous visit (from the past 2160 hours).     Assessment & Plan:   Problem List Items Addressed This Visit   None Visit Diagnoses       Other fatigue    -  Primary   Relevant Orders   Cortisol Dexamethasone  Reflex     Chronic pain of left thumb       Relevant Medications   traMADol (ULTRAM) 50 MG tablet   Other Relevant Orders   Ambulatory referral to Orthopedic Surgery     Chronic right shoulder pain       Relevant Medications   traMADol (ULTRAM) 50 MG tablet   Other Relevant Orders   Ambulatory referral to Orthopedic Surgery      Sending referral to Ortho to look at her thumb and her shoulder.  Will defer to them for further treatment decisions.   Ordering dexamethasone  suppression test for patient, will call with results when available.   Return in about 3 months (around 04/30/2024).   Total time spent: 20 minutes  Trenda Frisk, FNP  01/29/2024   This document may have been prepared by [facility] Voice Recognition software and as such may include unintentional dictation errors.

## 2024-01-31 ENCOUNTER — Other Ambulatory Visit

## 2024-01-31 ENCOUNTER — Other Ambulatory Visit: Payer: Self-pay

## 2024-02-03 ENCOUNTER — Encounter: Payer: Self-pay | Admitting: Family

## 2024-02-03 MED ORDER — TRAMADOL HCL 50 MG PO TABS: 100.0000 mg | ORAL_TABLET | Freq: Two times a day (BID) | ORAL | 1 refills | Status: DC

## 2024-02-09 LAB — CORTISOL DEXAMETHASONE REFLEX: Cortisol, Serum LCMS: <1 ug/dL

## 2024-02-12 ENCOUNTER — Ambulatory Visit: Payer: Self-pay

## 2024-02-21 ENCOUNTER — Ambulatory Visit
Admission: RE | Admit: 2024-02-21 | Discharge: 2024-02-21 | Disposition: A | Discharge status: Home / Self Care | Source: Ambulatory Visit | Attending: Emergency Medicine | Admitting: Emergency Medicine

## 2024-02-21 VITALS — BP 128/84 | HR 80 | Temp 98.8°F

## 2024-02-21 DIAGNOSIS — T148XXA Other injury of unspecified body region, initial encounter: Secondary | ICD-10-CM | POA: Diagnosis not present

## 2024-02-21 DIAGNOSIS — L089 Local infection of the skin and subcutaneous tissue, unspecified: Secondary | ICD-10-CM

## 2024-02-21 DIAGNOSIS — L03119 Cellulitis of unspecified part of limb: Secondary | ICD-10-CM

## 2024-02-21 MED ORDER — TETANUS-DIPHTH-ACELL PERTUSSIS 5-2.5-18.5 LF-MCG/0.5 IM SUSY
0.5000 mL | PREFILLED_SYRINGE | Freq: Once | INTRAMUSCULAR | Status: AC
  Administered 2024-02-21: 0.5 mL via INTRAMUSCULAR

## 2024-02-21 MED ORDER — DOXYCYCLINE HYCLATE 100 MG PO CAPS
100.0000 mg | ORAL_CAPSULE | Freq: Two times a day (BID) | ORAL | 0 refills | Status: AC
Start: 2024-02-21 — End: 2024-02-28

## 2024-02-21 NOTE — ED Triage Notes (Signed)
 Pt states she was stepping into her house and her storm door slammed on the back of her left foot on Saturday 5/31. Now pt is having swelling, pain and clear drainage with an odor at the site.

## 2024-02-21 NOTE — Discharge Instructions (Addendum)
 Keep your wound clean and dry.  Wash it gently twice a day with soap and water.  Then apply an antibiotic ointment and nonstick dressing.    Take the doxycycline  as directed.    Your tetanus was updated today.    Follow up with your primary care provider tomorrow.  Go to the emergency department if you have worsening symptoms.

## 2024-02-21 NOTE — ED Provider Notes (Signed)
 Kiara Woodard    CSN: [id number] Arrival date & time: 02/21/24  1900      History   Chief Complaint Chief Complaint  Patient presents with   Foot Injury    Storm door slammed on my heel Saturday. It isn't healing and is now leaking an unpleasant clear fluid and extremely painful. - Entered by patient    HPI Kiara Woodard is a 48 y.o. female.  Patient presents with a wound on her left posterior foot which has become red and painful and has some drainage.  She accidentally cut her foot on her storm door on 02/16/2024.  She has been cleaning the wound with peroxide and applying topical antibiotic.  No fever, chills, numbness, weakness.  Last tetanus 1994.  Patient's medical history includes diabetes.  Last A1c 9.1 on 11/22/2023.  The history is provided by the patient and medical records.    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   Chronic fatigue 02/18/2020   Chronic use of opiate drug for therapeutic purpose 02/18/2020   Fibromyalgia 02/18/2020   Positive ANA (antinuclear antibody) 02/18/2020   Hypothyroidism (acquired) 06/26/2019   Type 2 diabetes mellitus with hyperglycemia, without long-term current use of insulin  (HCC) 06/26/2019   Lumbar radiculopathy 02/12/2014    Past Surgical History:  Procedure Laterality Date   CONTINUOUS NERVE MONITORING Left 08/22/2023   Procedure: CONTINUOUS NERVE MONITORING;  Surgeon: Von Grumbling, MD;  Location: [facility];  Service: ENT;   Laterality: Left;   PAROTIDECTOMY Left 08/22/2023   Procedure: SUPERFICIAL PAROTIDECTOMY;  Surgeon: Von Grumbling, MD;  Location: [facility];  Service: ENT;  Laterality: Left;    OB History   No obstetric history on file.      Home Medications    Prior to Admission medications   Medication Sig Start Date End Date Taking? Authorizing Provider  doxycycline  (VIBRAMYCIN ) 100 MG capsule Take 1 capsule (100 mg total) by mouth 2 (two) times daily for 7 days. 02/21/24 02/28/24 Yes Wellington Half, NP  DULoxetine (CYMBALTA) 60 MG capsule TAKE 1 CAPSULE BY MOUTH TWICE A DAY 01/14/24  Yes Trenda Frisk, FNP  JARDIANCE 25 MG TABS tablet Take 25 mg by mouth daily.   Yes [provider]  levothyroxine (SYNTHROID) 100 MCG tablet TAKE 1 TABLET BY MOTUH ONCE DAILY ON AN EMPTY STOMACH. WAIT 30 MINS BEFORE TAKING OTHER MEDS 06/29/23  Yes Trenda Frisk, FNP  liothyronine (CYTOMEL) 5 MCG tablet Take 5 mcg by mouth 2 (two) times daily.   Yes [provider]  losartan (COZAAR) 25 MG tablet TAKE 1 TABLET BY MOUTH EVERY DAY 11/19/23  Yes Trenda Frisk, FNP  pregabalin  (LYRICA ) 150 MG capsule Take 1 capsule (150 mg total) by mouth 2 (two) times daily. 10/12/23  Yes Trenda Frisk, FNP  HYDROcodone -acetaminophen  (NORCO/VICODIN) 5-325 MG tablet Take 1-2 tablets by mouth every 6 (six) hours as needed for moderate pain (pain score 4-6). Patient not taking: Reported on 01/29/2024 08/22/23   Von Grumbling, MD  Semaglutide ,0.25  or 0.5MG /DOS, 2 MG/3ML SOPN Inject 0.5 mg into the skin once a week. Patient not taking: Reported on 01/29/2024 10/05/23   Trenda Frisk, FNP  traMADol  (ULTRAM ) 50 MG tablet Take 2 tablets (100 mg total) by mouth 2 (two) times daily. 02/03/24   Trenda Frisk, FNP    Family History Family History  Problem Relation Age of Onset   Breast cancer Maternal Aunt        mat great aunt    Social History Social History   Tobacco Use   Smoking status: Former    Types:  Cigarettes   Smokeless tobacco: Never  Vaping Use   Vaping status: Never Used  Substance Use Topics   Alcohol use: Not Currently   Drug use: Never     Allergies   Penicillin g   Review of Systems Review of Systems  Constitutional:  Negative for chills and fever.  Musculoskeletal:  Negative for arthralgias, gait problem and joint swelling.  Skin:  Positive for color change and wound.  Neurological:  Negative for weakness and numbness.     Physical Exam Triage Vital Signs ED Triage Vitals  Encounter Vitals Group     BP      Systolic BP Percentile      Diastolic BP Percentile      Pulse      Resp      Temp      Temp src      SpO2      Weight      Height      Head Circumference      Peak Flow      Pain Score      Pain Loc      Pain Education      Exclude from Growth Chart    No data found.  Updated Vital Signs BP 128/84 (BP Location: Left Arm)   Pulse 80   Temp 98.8 F (37.1 C)   LMP 02/11/2024 (Approximate)   SpO2 96%   Visual Acuity Right Eye Distance:   Left Eye Distance:   Bilateral Distance:    Right Eye Near:   Left Eye Near:    Bilateral Near:     Physical Exam Constitutional:      General: She is not in acute distress. HENT:     Mouth/Throat:     Mouth: Mucous membranes are moist.  Cardiovascular:     Rate and Rhythm: Normal rate and regular rhythm.  Pulmonary:     Effort: Pulmonary effort is normal. No respiratory distress.  Musculoskeletal:        General: No deformity. Normal range of motion.  Skin:    General: Skin is warm and dry.     Capillary Refill: Capillary refill takes less than 2 seconds.     Findings: Erythema and lesion present.     Comments: Open wound on left posterior foot with localized erythema.  See picture.   Neurological:     General: No focal deficit present.     Mental Status: She is alert.     Sensory: No sensory deficit.     Motor: No weakness.     Gait: Gait normal.      UC Treatments / Results   Labs (all labs ordered are listed, but only abnormal results are displayed) Labs Reviewed - No data to display  EKG   Radiology No results found.  Procedures Procedures (including critical care time)  Medications Ordered in UC Medications  Tdap (BOOSTRIX) injection 0.5 mL (has no administration in time range)    Initial Impression / Assessment and Plan / UC Course  I have reviewed the triage vital signs and the nursing notes.  Pertinent labs & imaging results that were available during my care of the patient were reviewed by me and considered in my medical decision making (see chart for details).    Infected wound, cellulitis of foot.  Afebrile and vital signs are stable.  Tetanus updated today.  Treating with doxycycline .  Wound care instructions discussed.  Education provided on infected wound and cellulitis.  Patient declines x-ray.  Instructed her to follow-up with her PCP tomorrow.  ED precautions given.  She agrees to plan of care.  Final Clinical Impressions(s) / UC Diagnoses   Final diagnoses:  Infected wound  Cellulitis of foot     Discharge Instructions      Keep your wound clean and dry.  Wash it gently twice a day with soap and water.  Then apply an antibiotic ointment and nonstick dressing.    Take the doxycycline  as directed.    Your tetanus was updated today.    Follow up with your primary care provider tomorrow.  Go to the emergency department if you have worsening symptoms.      ED Prescriptions     Medication Sig Dispense Auth. Provider   doxycycline  (VIBRAMYCIN ) 100 MG capsule Take 1 capsule (100 mg total) by mouth 2 (two) times daily for 7 days. 14 capsule Wellington Half, NP      PDMP not reviewed this encounter.   Wellington Half, NP 02/21/24 [phone]

## 2024-04-02 ENCOUNTER — Other Ambulatory Visit: Payer: Self-pay

## 2024-04-03 MED ORDER — SEMAGLUTIDE(0.25 OR 0.5MG/DOS) 2 MG/3ML ~~LOC~~ SOPN: 0.5000 mg | PEN_INJECTOR | SUBCUTANEOUS | 1 refills | Status: DC

## 2024-04-04 ENCOUNTER — Other Ambulatory Visit: Payer: Self-pay | Admitting: Family

## 2024-04-21 ENCOUNTER — Ambulatory Visit
Admission: RE | Admit: 2024-04-21 | Discharge: 2024-04-21 | Disposition: A | Discharge status: Home / Self Care | Source: Ambulatory Visit | Attending: Family Medicine | Admitting: Family Medicine

## 2024-04-21 VITALS — BP 139/90 | HR 71 | Temp 99.1°F | Resp 16

## 2024-04-21 DIAGNOSIS — E1165 Type 2 diabetes mellitus with hyperglycemia: Secondary | ICD-10-CM | POA: Insufficient documentation

## 2024-04-21 DIAGNOSIS — R5383 Other fatigue: Secondary | ICD-10-CM | POA: Insufficient documentation

## 2024-04-21 DIAGNOSIS — R350 Frequency of micturition: Secondary | ICD-10-CM | POA: Insufficient documentation

## 2024-04-21 DIAGNOSIS — B379 Candidiasis, unspecified: Secondary | ICD-10-CM | POA: Diagnosis present

## 2024-04-21 DIAGNOSIS — B9689 Other specified bacterial agents as the cause of diseases classified elsewhere: Secondary | ICD-10-CM | POA: Diagnosis present

## 2024-04-21 DIAGNOSIS — N898 Other specified noninflammatory disorders of vagina: Secondary | ICD-10-CM | POA: Insufficient documentation

## 2024-04-21 DIAGNOSIS — N760 Acute vaginitis: Secondary | ICD-10-CM | POA: Insufficient documentation

## 2024-04-21 LAB — URINALYSIS, W/ REFLEX TO CULTURE (INFECTION SUSPECTED)
Bilirubin Urine: NEGATIVE
Glucose, UA: NEGATIVE mg/dL
Hgb urine dipstick: NEGATIVE
Ketones, ur: NEGATIVE mg/dL
Leukocytes,Ua: NEGATIVE
Nitrite: NEGATIVE
Protein, ur: NEGATIVE mg/dL
Specific Gravity, Urine: 1.01 (ref 1.005–1.030)
pH: 5.5 (ref 5.0–8.0)

## 2024-04-21 LAB — GLUCOSE, CAPILLARY: Glucose-Capillary: 133 mg/dL — ABNORMAL HIGH (ref 70–99)

## 2024-04-21 NOTE — ED Triage Notes (Signed)
 For the past 2 days patient has abdominal pain, fatigue and urinary frequency. She took her BS when her symptoms first started and it was in the 200's. Her urine is also dark and she is worried she may have a UTI.

## 2024-04-21 NOTE — ED Provider Notes (Signed)
 [facility]    CSN: [id number] Arrival date & time: 04/21/24  1813      History   Chief Complaint Chief Complaint  Patient presents with  . Fatigue  . Generalized Body Aches  . Urinary Frequency    HPI Kiara Woodard is a 48 y.o. female.   HPI  History obtained from the patient. Delrose presents for body aches, fatigue, lightheadedness, urinary frequency with decreased urine, dark urine and lower abdominal pain. She woke up today after her nap and was sweaty.  Blood sugar was 239. Takes low dose Ozempic .  Has some crusty white discharge   Last A1c 8.2 in early July and was 9.1 in March 2025. TSH was March 2025. Takes Synthryoid reguarlay and there have been no dose changes. Has history of fibromyalgia         Past Medical History:  Diagnosis Date  . Chronic fatigue 02/18/2020  . Fibromyalgia 02/18/2020  . Hypertension   . Hypothyroidism (acquired) 06/26/2019  . Lumbar radiculopathy 02/12/2014  . Neoplasm of uncertain behavior of parotid gland   . Patellar subluxation, right, initial encounter 05/22/2023  . Type 2 diabetes mellitus without complication, without long-term current use of insulin  (HCC) 06/26/2019    Patient Active Problem List   Diagnosis Date Noted  . Moderately severe major depression (HCC) 10/13/2023  . 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 Chronic fatigue 02/18/2020  . Chronic use of opiate drug for therapeutic purpose 02/18/2020  . Fibromyalgia 02/18/2020  . Positive ANA (antinuclear antibody) 02/18/2020  . Hypothyroidism (acquired) 06/26/2019  . Type 2 diabetes mellitus with hyperglycemia, without long-term current use of insulin  (HCC) 06/26/2019  . Lumbar radiculopathy 02/12/2014    Past Surgical History:  Procedure Laterality Date  . CONTINUOUS NERVE MONITORING Left 08/22/2023   Procedure: CONTINUOUS NERVE MONITORING;  Surgeon: Blair Mt, MD;  Location: [facility];  Service: ENT;  Laterality: Left;  . PAROTIDECTOMY Left  08/22/2023   Procedure: SUPERFICIAL PAROTIDECTOMY;  Surgeon: Blair Mt, MD;  Location: [facility];  Service: ENT;  Laterality: Left;    OB History   No obstetric history on file.      Home Medications    Prior to Admission medications   Medication Sig Start Date End Date Taking? Authorizing Provider  DULoxetine (CYMBALTA) 60 MG capsule TAKE 1 CAPSULE BY MOUTH TWICE A DAY 01/14/24   Orlean Alan HERO, FNP  HYDROcodone -acetaminophen  (NORCO/VICODIN) 5-325 MG tablet Take 1-2 tablets by mouth every 6 (six) hours as needed for moderate pain (pain score 4-6). Patient not taking: Reported on 01/29/2024 08/22/23   Blair Mt, MD  JARDIANCE 25 MG TABS tablet Take 25 mg by mouth daily.    [provider]  levothyroxine (SYNTHROID) 100 MCG tablet TAKE 1 TABLET BY MOTUH ONCE DAILY ON AN EMPTY STOMACH. WAIT 30 MINS BEFORE TAKING OTHER MEDS 04/04/24   Orlean Alan HERO, FNP  liothyronine (CYTOMEL) 5 MCG tablet Take 5 mcg by mouth 2 (two) times daily.    [provider]  losartan (COZAAR) 25 MG tablet TAKE 1 TABLET BY MOUTH EVERY DAY 11/19/23   Orlean Alan HERO, FNP  pregabalin  (LYRICA ) 150 MG capsule Take 1 capsule (150 mg total) by mouth 2 (two) times daily. 10/12/23   Orlean Alan HERO, FNP  Semaglutide ,0.25 or 0.5MG /DOS, 2 MG/3ML SOPN Inject 0.5 mg into the skin once a week. 04/03/24   Orlean Alan HERO, FNP  traMADol  (ULTRAM ) 50 MG tablet Take 2 tablets (100 mg total) by mouth 2 (two) times  daily. 02/03/24   Orlean Alan HERO, FNP    Family History Family History  Problem Relation Age of Onset  . Breast cancer Maternal Aunt        mat great aunt    Social History Social History   Tobacco Use  . Smoking status: Former    Types: Cigarettes  . Smokeless tobacco: Never  Vaping Use  . Vaping status: Never Used  Substance Use Topics  . Alcohol use: Not Currently  . Drug use: Never     Allergies   Penicillin g   Review of Systems Review of Systems: negative unless  otherwise stated in HPI.      Physical Exam Triage Vital Signs ED Triage Vitals  Encounter Vitals Group     BP 04/21/24 1835 (!) 139/90     Girls Systolic BP Percentile --      Girls Diastolic BP Percentile --      Boys Systolic BP Percentile --      Boys Diastolic BP Percentile --      Pulse Rate 04/21/24 1835 71     Resp 04/21/24 1835 16     Temp 04/21/24 1835 99.1 F (37.3 C)     Temp Source 04/21/24 1835 Oral     SpO2 04/21/24 1835 94 %     Weight --      Height --      Head Circumference --      Peak Flow --      Pain Score 04/21/24 1833 6     Pain Loc --      Pain Education --      Exclude from Growth Chart --    No data found.  Updated Vital Signs BP (!) 139/90 (BP Location: Right Arm)   Pulse 71   Temp 99.1 F (37.3 C) (Oral)   Resp 16   LMP 04/15/2024 (Approximate)   SpO2 94%   Visual Acuity Right Eye Distance:   Left Eye Distance:   Bilateral Distance:    Right Eye Near:   Left Eye Near:    Bilateral Near:     Physical Exam GEN:     alert, non-toxic appearing female in no distress ***   HENT:  mucus membranes moist, oropharyngeal ***without lesions or ***erythema, no*** tonsillar hypertrophy or exudates, *** moderate erythematous edematous turbinates, ***clear nasal discharge, ***bilateral TM normal EYES:   pupils equal and reactive, ***no scleral injection or discharge NECK:  normal ROM, no ***lymphadenopathy, ***no meningismus   RESP:  no increased work of breathing, ***clear to auscultation bilaterally CVS:   regular rate ***and rhythm Skin:   warm and dry, no rash on visible skin***    UC Treatments / Results  Labs (all labs ordered are listed, but only abnormal results are displayed) Labs Reviewed  URINALYSIS, W/ REFLEX TO CULTURE (INFECTION SUSPECTED)    EKG   Radiology No results found.  Procedures Procedures (including critical care time)  Medications Ordered in UC Medications - No data to display  Initial Impression /  Assessment and Plan / UC Course  I have reviewed the triage vital signs and the nursing notes.  Pertinent labs & imaging results that were available during my care of the patient were reviewed by me and considered in my medical decision making (see chart for details).       Pt is a 48 y.o. female who presents for *** days of respiratory symptoms. Kathrynne is ***afebrile here without recent antipyretics. Satting well on  room air. Overall pt is ***non-toxic appearing, well hydrated, without respiratory distress. Pulmonary exam ***is unremarkable.  COVID and influenza panel obtained ***and was negative. ***Pt to quarantine until COVID test results or longer if positive.  I will call patient with test results, if positive. History consistent with ***viral respiratory illness. Discussed symptomatic treatment.  Explained lack of efficacy of antibiotics in viral disease.  Typical duration of symptoms discussed.   Return and ED precautions given and voiced understanding. Discussed MDM, treatment plan and plan for follow-up with patient*** who agrees with plan.     Final Clinical Impressions(s) / UC Diagnoses   Final diagnoses:  None   Discharge Instructions   None    ED Prescriptions   None    PDMP not reviewed this encounter.

## 2024-04-21 NOTE — Discharge Instructions (Signed)
 You did not have evidence of a urinary tract infection. I sent your urine for culture to be sure. Someone may call you to stop/change antibiotics based off your culture results. Stop by the pharmacy to pick up your prescriptions.  Follow up with your primary care provider or return to the urgent care, if not improving.   Your blood sugar here was 133.  Be sure to follow up with your primary care provider to discuss checking your thyroid  function again.     Your vaginal swab test results will be available in the next 72 hours. If positive, someone will contact you.  You should see your results in your MyChart account.

## 2024-04-22 LAB — CERVICOVAGINAL ANCILLARY ONLY
Bacterial Vaginitis (gardnerella): POSITIVE — AB
Candida Glabrata: NEGATIVE
Candida Vaginitis: POSITIVE — AB
Chlamydia: NEGATIVE
Comment: NEGATIVE
Comment: NEGATIVE
Comment: NEGATIVE
Comment: NEGATIVE
Comment: NEGATIVE
Comment: NORMAL
Neisseria Gonorrhea: NEGATIVE
Trichomonas: NEGATIVE

## 2024-04-23 ENCOUNTER — Telehealth: Payer: Self-pay | Admitting: Family Medicine

## 2024-04-23 ENCOUNTER — Ambulatory Visit: Payer: Self-pay | Admitting: Family Medicine

## 2024-04-23 LAB — URINE CULTURE: Culture: NO GROWTH

## 2024-04-23 MED ORDER — METRONIDAZOLE 500 MG PO TABS: 500.0000 mg | ORAL_TABLET | Freq: Two times a day (BID) | ORAL | 0 refills | Status: DC

## 2024-04-23 MED ORDER — FLUCONAZOLE 150 MG PO TABS: 150.0000 mg | ORAL_TABLET | ORAL | 0 refills | Status: AC

## 2024-04-23 NOTE — Telephone Encounter (Signed)
 Called and left secure voicemail with vaginal swab results being positive for yeast and BV.  Medication sent to the pharmacy.  Metronidazole  and Diflucan  sent to the pharmacy.

## 2024-04-24 ENCOUNTER — Other Ambulatory Visit: Payer: Self-pay | Admitting: Family

## 2024-04-30 ENCOUNTER — Ambulatory Visit: Admitting: Family

## 2024-04-30 ENCOUNTER — Ambulatory Visit: Payer: Self-pay | Admitting: Family

## 2024-04-30 ENCOUNTER — Encounter: Payer: Self-pay | Admitting: Family

## 2024-04-30 VITALS — BP 130/80 | HR 63 | Ht 69.0 in | Wt 222.2 lb

## 2024-04-30 DIAGNOSIS — E039 Hypothyroidism, unspecified: Secondary | ICD-10-CM | POA: Diagnosis not present

## 2024-04-30 DIAGNOSIS — N390 Urinary tract infection, site not specified: Secondary | ICD-10-CM | POA: Diagnosis not present

## 2024-04-30 DIAGNOSIS — R350 Frequency of micturition: Secondary | ICD-10-CM | POA: Diagnosis not present

## 2024-04-30 DIAGNOSIS — M797 Fibromyalgia: Secondary | ICD-10-CM

## 2024-04-30 DIAGNOSIS — R5382 Chronic fatigue, unspecified: Secondary | ICD-10-CM

## 2024-04-30 DIAGNOSIS — R300 Dysuria: Secondary | ICD-10-CM

## 2024-04-30 DIAGNOSIS — E1165 Type 2 diabetes mellitus with hyperglycemia: Secondary | ICD-10-CM

## 2024-04-30 LAB — POCT URINALYSIS DIPSTICK
Bilirubin, UA: NEGATIVE
Blood, UA: POSITIVE
Glucose, UA: NEGATIVE
Ketones, UA: NEGATIVE
Nitrite, UA: NEGATIVE
Protein, UA: NEGATIVE
Spec Grav, UA: 1.02 (ref 1.010–1.025)
Urobilinogen, UA: 0.2 U/dL
pH, UA: 6 (ref 5.0–8.0)

## 2024-04-30 MED ORDER — TAMSULOSIN HCL 0.4 MG PO CAPS: 0.4000 mg | ORAL_CAPSULE | Freq: Every day | ORAL | 0 refills | Status: DC

## 2024-04-30 MED ORDER — NITROFURANTOIN MONOHYD MACRO 100 MG PO CAPS: 100.0000 mg | ORAL_CAPSULE | Freq: Two times a day (BID) | ORAL | 0 refills | Status: DC

## 2024-04-30 NOTE — Progress Notes (Signed)
 Established Patient Office Visit  Subjective:  Patient ID: Kiara Woodard, female    DOB: September 09, 1976  Age: 48 y.o. MRN: [id number]  Chief Complaint  Patient presents with   Follow-up    3 month   Patient is here today for her 3 months follow up.  She has been feeling about the same since last appointment.   She does have additional concerns to discuss today.  She has had some urinary discomfort and pressure.  Did go to an UC for evaluation, they sent her some antibiotics, but they haven't cleared up her symptoms thus far.   Labs are due today.  She needs refills.   I have reviewed her active problem list, medication list, allergies, health maintenance, notes from last encounter, lab results for her appointment today.      No other concerns at this time.   Past Medical History:  Diagnosis Date   Chronic fatigue 02/18/2020   Fibromyalgia 02/18/2020   Hypertension    Hypothyroidism (acquired) 06/26/2019   Lumbar radiculopathy 02/12/2014   Neoplasm of uncertain behavior of parotid gland    Patellar subluxation, right, initial encounter 05/22/2023   Type 2 diabetes mellitus without complication, without long-term current use of insulin  (HCC) 06/26/2019    Past Surgical History:  Procedure Laterality Date   CONTINUOUS NERVE MONITORING Left 08/22/2023   Procedure: CONTINUOUS NERVE MONITORING;  Surgeon: Blair Mt, MD;  Location: [facility] OR[facility];  Service: ENT;  Laterality: Left;   PAROTIDECTOMY Left 08/22/2023   Procedure: SUPERFICIAL PAROTIDECTOMY;  Surgeon: Blair Mt, MD;  Location: [facility];  Service: ENT;  Laterality: Left;    Social History   Socioeconomic History   Marital status: Married    Spouse name: Not on file   Number of children: Not on file   Years of education: Not on file   Highest education level: Not on file  Occupational History   Not on file  Tobacco Use   Smoking status: Former    Types: Cigarettes   Smokeless tobacco: Never  Vaping Use    Vaping status: Never Used  Substance and Sexual Activity   Alcohol use: Not Currently   Drug use: Never   Sexual activity: Yes  Other Topics Concern   Not on file  Social History Narrative   Lives at home with daughter.    Social Drivers of Corporate investment banker Strain: Not on file  Food Insecurity: Not on file  Transportation Needs: Not on file  Physical Activity: Not on file  Stress: Not on file  Social Connections: Not on file  Intimate Partner Violence: Not on file    Family History  Problem Relation Age of Onset   Breast cancer Maternal Aunt        mat great aunt    Allergies  Allergen Reactions   Penicillin G Itching    Fever, aches, itching    Review of Systems  Constitutional:  Positive for malaise/fatigue.  Genitourinary:  Positive for dysuria, flank pain, frequency and urgency.  All other systems reviewed and are negative.      Objective:   BP 130/80   Pulse 63   Ht 5' 9 (1.753 m)   Wt 222 lb 3.2 oz (100.8 kg)   LMP 04/15/2024 (Approximate)   SpO2 96%   BMI 32.81 kg/m   Vitals:   04/30/24 1509  BP: 130/80  Pulse: 63  Height: 5' 9 (1.753 m)  Weight: 222 lb 3.2 oz (100.8 kg)  SpO2:  96%  BMI (Calculated): 32.8    Physical Exam Vitals and nursing note reviewed.  Constitutional:      Appearance: Normal appearance. She is normal weight.  HENT:     Head: Normocephalic.  Eyes:     Extraocular Movements: Extraocular movements intact.     Conjunctiva/sclera: Conjunctivae normal.     Pupils: Pupils are equal, round, and reactive to light.  Cardiovascular:     Rate and Rhythm: Normal rate.  Pulmonary:     Effort: Pulmonary effort is normal.  Neurological:     General: No focal deficit present.     Mental Status: She is alert and oriented to person, place, and time. Mental status is at baseline.     Motor: Weakness present.  Psychiatric:        Mood and Affect: Mood normal.        Behavior: Behavior normal.        Thought  Content: Thought content normal.        Judgment: Judgment normal.      Results for orders placed or performed in visit on 04/30/24  Urine Culture   Specimen: Urine, Clean Catch   UR  Result Value Ref Range   Urine Culture, Routine Final report (A)    Organism ID, Bacteria Escherichia coli (A)    Antimicrobial Susceptibility Comment   Microscopic Examination  Result Value Ref Range   WBC, UA >30 (A) 0 - 5 /hpf   RBC, Urine 3-10 (A) 0 - 2 /hpf   Epithelial Cells (non renal) 0-10 0 - 10 /hpf   Casts None seen None seen /lpf   Bacteria, UA Many (A) None seen/Few  Urinalysis, Routine w reflex microscopic  Result Value Ref Range   Specific Gravity, UA 1.015 1.005 - 1.030   pH, UA 6.0 5.0 - 7.5   Color, UA Yellow Yellow   Appearance Ur Cloudy (A) Clear   Leukocytes,UA 1+ (A) Negative   Protein,UA Negative Negative/Trace   Glucose, UA Negative Negative   Ketones, UA Negative Negative   RBC, UA Trace (A) Negative   Bilirubin, UA Negative Negative   Urobilinogen, Ur 0.2 0.2 - 1.0 mg/dL   Nitrite, UA Positive (A) Negative   Microscopic Examination See below:   POCT Urinalysis Dipstick  Result Value Ref Range   Color, UA orange    Clarity, UA cloudy    Glucose, UA Negative Negative   Bilirubin, UA negative    Ketones, UA negative    Spec Grav, UA 1.020 1.010 - 1.025   Blood, UA positive    pH, UA 6.0 5.0 - 8.0   Protein, UA Negative Negative   Urobilinogen, UA 0.2 0.2 or 1.0 E.U./dL   Nitrite, UA negative    Leukocytes, UA Trace (A) Negative   Appearance cloudy    Odor yes     Recent Results (from the past 2160 hours)  Urinalysis, w/ Reflex to Culture (Infection Suspected) -Urine, Clean Catch     Status: Abnormal   Collection Time: 04/21/24  6:47 PM  Result Value Ref Range   Specimen Source URINE, CLEAN CATCH    Color, Urine YELLOW YELLOW   APPearance CLEAR CLEAR   Specific Gravity, Urine 1.010 1.005 - 1.030   pH 5.5 5.0 - 8.0   Glucose, UA NEGATIVE NEGATIVE mg/dL    Hgb urine dipstick NEGATIVE NEGATIVE   Bilirubin Urine NEGATIVE NEGATIVE   Ketones, ur NEGATIVE NEGATIVE mg/dL   Protein, ur NEGATIVE NEGATIVE mg/dL   Nitrite  NEGATIVE NEGATIVE   Leukocytes,Ua NEGATIVE NEGATIVE   Squamous Epithelial / HPF 6-10 0 - 5 /HPF   WBC, UA 0-5 0 - 5 WBC/hpf    Comment: Reflex urine culture not performed if WBC <=10, OR if Squamous epithelial cells >5. If Squamous epithelial cells >5, suggest recollection.   RBC / HPF 0-5 0 - 5 RBC/hpf   Bacteria, UA FEW (A) NONE SEEN    Comment: Performed at [facility], [address], [place], [place] [address]  Urine Culture     Status: None   Collection Time: 04/21/24  6:47 PM   Specimen: Urine, Clean Catch  Result Value Ref Range   Specimen Description      URINE, CLEAN CATCH Performed at [facility], [address], [place], [place] [address]    Special Requests      NONE Performed at [facility], [address], [place], [place] [address]    Culture      NO GROWTH Performed at [facility], [address], [place], [place] [address]    Report Status 04/23/2024 FINAL   Glucose, capillary     Status: Abnormal   Collection Time: 04/21/24  7:18 PM  Result Value Ref Range   Glucose-Capillary 133 (H) 70 - 99 mg/dL    Comment: Glucose reference range applies only to samples taken after fasting for at least 8 hours.  Cervicovaginal ancillary only     Status: Abnormal   Collection Time: 04/21/24  7:20 PM  Result Value Ref Range   Neisseria Gonorrhea Negative    Chlamydia Negative    Trichomonas Negative    Bacterial Vaginitis (gardnerella) Positive (A)    Candida Vaginitis Positive (A)    Candida Glabrata Negative    Comment Normal Reference Ranger Chlamydia - Negative    Comment      Normal Reference Range Neisseria Gonorrhea - Negative   Comment      Normal Reference Range Bacterial Vaginosis - Negative   Comment Normal Reference Range Candida Species -  Negative    Comment Normal Reference Range Candida Galbrata - Negative    Comment Normal Reference Range Trichomonas - Negative   POCT Urinalysis Dipstick     Status: Abnormal   Collection Time: 04/30/24  3:54 PM  Result Value Ref Range   Color, UA orange    Clarity, UA cloudy    Glucose, UA Negative Negative   Bilirubin, UA negative    Ketones, UA negative    Spec Grav, UA 1.020 1.010 - 1.025   Blood, UA positive    pH, UA 6.0 5.0 - 8.0   Protein, UA Negative Negative   Urobilinogen, UA 0.2 0.2 or 1.0 E.U./dL   Nitrite, UA negative    Leukocytes, UA Trace (A) Negative   Appearance cloudy    Odor yes   Urinalysis, Routine w reflex microscopic     Status: Abnormal   Collection Time: 04/30/24  4:00 PM  Result Value Ref Range   Specific Gravity, UA 1.015 1.005 - 1.030   pH, UA 6.0 5.0 - 7.5   Color, UA Yellow Yellow   Appearance Ur Cloudy (A) Clear   Leukocytes,UA 1+ (A) Negative   Protein,UA Negative Negative/Trace   Glucose, UA Negative Negative   Ketones, UA Negative Negative   RBC, UA Trace (A) Negative   Bilirubin, UA Negative Negative   Urobilinogen, Ur 0.2 0.2 - 1.0 mg/dL   Nitrite, UA Positive (A) Negative  Microscopic Examination See below:     Comment: Microscopic was indicated and was performed.  Urine Culture     Status: Abnormal   Collection Time: 04/30/24  4:00 PM   Specimen: Urine, Clean Catch   UR  Result Value Ref Range   Urine Culture, Routine Final report (A)    Organism ID, Bacteria Escherichia coli (A)     Comment: Cefazolin with an MIC <=16 predicts susceptibility to the oral agents cefaclor, cefdinir, cefpodoxime, cefprozil, cefuroxime, cephalexin, and loracarbef when used for therapy of uncomplicated urinary tract infections due to E. coli, Klebsiella pneumoniae, and Proteus mirabilis. Greater than 100,000 colony forming units per mL    Antimicrobial Susceptibility Comment     Comment:       ** S = Susceptible; I = Intermediate; R = Resistant  **                    P = Positive; N = Negative             MICS are expressed in micrograms per mL    Antibiotic                 RSLT#1    RSLT#2    RSLT#3    RSLT#4 Amoxicillin/Clavulanic Acid    S Ampicillin                     S Cefazolin                      S Cefepime                       S Cefoxitin                      S Cefpodoxime                    S Ceftriaxone                    S Ciprofloxacin                  S Ertapenem                      S Gentamicin                     S Levofloxacin                   S Meropenem                      S Nitrofurantoin                  I Piperacillin/Tazobactam        S Tetracycline                   S Tobramycin                     S Trimethoprim /Sulfa              S   Microscopic Examination     Status: Abnormal   Collection Time: 04/30/24  4:00 PM  Result Value Ref Range   WBC, UA >30 (A) 0 - 5 /hpf   RBC, Urine 3-10 (A) 0 - 2 /hpf   Epithelial Cells (non renal) 0-10 0 -  10 /hpf   Casts None seen None seen /lpf   Bacteria, UA Many (A) None seen/Few       Assessment & Plan Dysuria Urinary frequency Urinary tract infection without hematuria, site unspecified UA in office today still abnormal.  Suspect antibiotic resistance with the previous meds, will send new antibiotic and sending for UA/UC.  Will call with results when available.   Hypothyroidism (acquired) Patient is seen by endocrinology, who manage this condition.  She is well controlled with current therapy.   Will defer to them for further changes to plan of care.  Type 2 diabetes mellitus with hyperglycemia, without long-term current use of insulin  [facility]) Patient has appointment set with Dr. Kathie office.  Will defer to them for further changes.    Fibromyalgia Chronic fatigue Patient stable.  Well controlled with current therapy.   Continue current meds.      Return in about 3 months (around 07/31/2024).   Total time spent: 20  minutes  ALAN CHRISTELLA ARRANT, FNP  04/30/2024   This document may have been prepared by [facility] Voice Recognition software and as such may include unintentional dictation errors.

## 2024-05-01 LAB — URINALYSIS, ROUTINE W REFLEX MICROSCOPIC
Bilirubin, UA: NEGATIVE
Glucose, UA: NEGATIVE
Ketones, UA: NEGATIVE
Nitrite, UA: POSITIVE — AB
Protein,UA: NEGATIVE
Specific Gravity, UA: 1.015 (ref 1.005–1.030)
Urobilinogen, Ur: 0.2 mg/dL (ref 0.2–1.0)
pH, UA: 6 (ref 5.0–7.5)

## 2024-05-01 LAB — MICROSCOPIC EXAMINATION
Casts: NONE SEEN /LPF
WBC, UA: >30 /HPF — AB (ref 0–5)

## 2024-05-02 LAB — URINE CULTURE

## 2024-05-05 MED ORDER — SULFAMETHOXAZOLE-TRIMETHOPRIM 800-160 MG PO TABS: 1.0000 | ORAL_TABLET | Freq: Two times a day (BID) | ORAL | 0 refills | Status: AC

## 2024-05-05 NOTE — Progress Notes (Signed)
 Patient notified

## 2024-05-18 NOTE — Assessment & Plan Note (Signed)
 Patient has appointment set with Dr. Kathie office.  Will defer to them for further changes.

## 2024-05-18 NOTE — Assessment & Plan Note (Signed)
 Patient stable.  Well controlled with current therapy.   Continue current meds.

## 2024-05-18 NOTE — Assessment & Plan Note (Signed)
 Patient is seen by endocrinology, who manage this condition.  She is well controlled with current therapy.   Will defer to them for further changes to plan of care.

## 2024-05-30 ENCOUNTER — Telehealth: Payer: Self-pay | Admitting: Family

## 2024-05-30 NOTE — Telephone Encounter (Signed)
 Patient left VM requesting we send in a steroid for her. She has hurt her back/sciatic nerve and no longer takes tramadol . She said Tylenol  and ibuprofen are not helping.

## 2024-06-03 ENCOUNTER — Other Ambulatory Visit: Payer: Self-pay

## 2024-06-03 ENCOUNTER — Ambulatory Visit
Admission: RE | Admit: 2024-06-03 | Discharge: 2024-06-03 | Disposition: A | Discharge status: Home / Self Care | Attending: Cardiology | Admitting: Cardiology

## 2024-06-03 ENCOUNTER — Ambulatory Visit (INDEPENDENT_AMBULATORY_CARE_PROVIDER_SITE_OTHER): Admitting: Cardiology

## 2024-06-03 ENCOUNTER — Ambulatory Visit: Payer: Self-pay | Admitting: Cardiology

## 2024-06-03 ENCOUNTER — Encounter: Payer: Self-pay | Admitting: Cardiology

## 2024-06-03 ENCOUNTER — Ambulatory Visit
Admission: RE | Admit: 2024-06-03 | Discharge: 2024-06-03 | Disposition: A | Discharge status: Home / Self Care | Source: Ambulatory Visit | Attending: Cardiology | Admitting: Cardiology

## 2024-06-03 VITALS — BP 130/62 | HR 70 | Ht 69.0 in | Wt 219.0 lb

## 2024-06-03 DIAGNOSIS — R200 Anesthesia of skin: Secondary | ICD-10-CM | POA: Diagnosis not present

## 2024-06-03 DIAGNOSIS — R109 Unspecified abdominal pain: Secondary | ICD-10-CM | POA: Diagnosis not present

## 2024-06-03 DIAGNOSIS — Z0130 Encounter for examination of blood pressure without abnormal findings: Secondary | ICD-10-CM

## 2024-06-03 DIAGNOSIS — M549 Dorsalgia, unspecified: Secondary | ICD-10-CM | POA: Insufficient documentation

## 2024-06-03 LAB — POCT URINALYSIS DIPSTICK
Bilirubin, UA: NEGATIVE
Blood, UA: NEGATIVE
Glucose, UA: NEGATIVE
Ketones, UA: NEGATIVE
Leukocytes, UA: NEGATIVE
Nitrite, UA: NEGATIVE
Protein, UA: NEGATIVE
Spec Grav, UA: 1.01 (ref 1.010–1.025)
Urobilinogen, UA: 0.2 U/dL
pH, UA: 7 (ref 5.0–8.0)

## 2024-06-03 MED ORDER — SEMAGLUTIDE(0.25 OR 0.5MG/DOS) 2 MG/3ML ~~LOC~~ SOPN: 0.5000 mg | PEN_INJECTOR | SUBCUTANEOUS | 1 refills | Status: DC

## 2024-06-03 MED ORDER — BACLOFEN 10 MG PO TABS: 10.0000 mg | ORAL_TABLET | Freq: Every day | ORAL | 1 refills | Status: DC

## 2024-06-03 NOTE — Telephone Encounter (Signed)
 Seeing amber today for this

## 2024-06-03 NOTE — Progress Notes (Signed)
 Established Patient Office Visit  Subjective:  Patient ID: Kiara Woodard, female    DOB: 05-17-1976  Age: 48 y.o. MRN: [id number]  Chief Complaint  Patient presents with   Acute Visit    L Legs Sciatic Pain/ Bilateral Lower back pain    Patient in office for an acute visit, complaining of left leg pain, left knee pain, lower back pain. Patient states she has had back pain for many years. Now she is experiencing left leg pain and knee pain. On Lyrica  for fibromyalgia. 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 Will get xrays to check for acute changes. Will send in Baclofen .  UA today normal.     No other concerns at this time.   Past Medical History:  Diagnosis Date   Chronic fatigue 02/18/2020   Fibromyalgia 02/18/2020   Hypertension    Hypothyroidism (acquired) 06/26/2019   Lumbar radiculopathy 02/12/2014   Neoplasm of uncertain behavior of parotid gland    Patellar subluxation, right, initial encounter 05/22/2023   Type 2 diabetes mellitus without complication, without long-term current use of insulin  (HCC) 06/26/2019    Past Surgical History:  Procedure Laterality Date   CONTINUOUS NERVE MONITORING Left 08/22/2023   Procedure: CONTINUOUS NERVE MONITORING;  Surgeon: Blair Mt, MD;  Location: [facility];  Service: ENT;  Laterality: Left;   PAROTIDECTOMY Left 08/22/2023   Procedure: SUPERFICIAL PAROTIDECTOMY;  Surgeon: Blair Mt, MD;  Location: [facility];  Service: ENT;  Laterality: Left;    Social History   Socioeconomic History   Marital status: Married    Spouse name: Not on file   Number of children: Not on file   Years of education: Not on file   Highest education level: Not on file  Occupational History   Not on file  Tobacco Use   Smoking status: Former    Types: Cigarettes   Smokeless tobacco: Never  Vaping Use   Vaping status: Never Used  Substance and Sexual Activity   Alcohol use: Not Currently   Drug use:  Never   Sexual activity: Yes  Other Topics Concern   Not on file  Social History Narrative   Lives at home with daughter.    Social Drivers of Corporate investment banker Strain: Not on file  Food Insecurity: Not on file  Transportation Needs: Not on file  Physical Activity: Not on file  Stress: Not on file  Social Connections: Not on file  Intimate Partner Violence: Not on file    Family History  Problem Relation Age of Onset   Breast cancer Maternal Aunt        mat great aunt    Allergies  Allergen Reactions   Penicillin G Itching    Fever, aches, itching    Outpatient Medications Prior to Visit  Medication Sig   DULoxetine (CYMBALTA) 60 MG capsule TAKE 1 CAPSULE BY MOUTH TWICE A DAY   levothyroxine (SYNTHROID) 100 MCG tablet TAKE 1 TABLET BY MOTUH ONCE DAILY ON AN EMPTY STOMACH. WAIT 30 MINS BEFORE TAKING OTHER MEDS   liothyronine (CYTOMEL) 5 MCG tablet Take 5 mcg by mouth 2 (two) times daily.   losartan (COZAAR) 25 MG tablet TAKE 1 TABLET BY MOUTH EVERY DAY   pregabalin  (LYRICA ) 150 MG capsule TAKE 1 CAPSULE BY MOUTH TWICE A DAY   Semaglutide ,0.25 or 0.5MG /DOS, 2 MG/3ML SOPN Inject 0.5 mg into the skin once a week.   metroNIDAZOLE  (FLAGYL ) 500 MG tablet Take  1 tablet (500 mg total) by mouth 2 (two) times daily. (Patient not taking: Reported on 06/03/2024)   [DISCONTINUED] HYDROcodone -acetaminophen  (NORCO/VICODIN) 5-325 MG tablet Take 1-2 tablets by mouth every 6 (six) hours as needed for moderate pain (pain score 4-6). (Patient not taking: Reported on 04/30/2024)   [DISCONTINUED] JARDIANCE 25 MG TABS tablet Take 25 mg by mouth daily. (Patient not taking: Reported on 06/03/2024)   [DISCONTINUED] nitrofurantoin , macrocrystal-monohydrate, (MACROBID ) 100 MG capsule Take 1 capsule (100 mg total) by mouth 2 (two) times daily. (Patient not taking: Reported on 06/03/2024)   [DISCONTINUED] tamsulosin  (FLOMAX ) 0.4 MG CAPS capsule Take 1 capsule (0.4 mg total) by mouth daily after  supper. (Patient not taking: Reported on 06/03/2024)   [DISCONTINUED] traMADol  (ULTRAM ) 50 MG tablet Take 2 tablets (100 mg total) by mouth 2 (two) times daily. (Patient not taking: Reported on 04/30/2024)   No facility-administered medications prior to visit.    Review of Systems  Constitutional: Negative.   HENT: Negative.    Eyes: Negative.   Respiratory: Negative.  Negative for shortness of breath.   Cardiovascular: Negative.  Negative for chest pain.  Gastrointestinal: Negative.  Negative for abdominal pain, constipation and diarrhea.  Genitourinary: Negative.   Musculoskeletal:  Negative for joint pain and myalgias.  Skin: Negative.   Neurological: Negative.  Negative for dizziness and headaches.  Endo/Heme/Allergies: Negative.   All other systems reviewed and are negative.      Objective:   BP 130/62   Pulse 70   Ht 5' 9 (1.753 m)   Wt 219 lb (99.3 kg)   SpO2 97%   BMI 32.34 kg/m   Vitals:   06/03/24 1312  BP: 130/62  Pulse: 70  Height: 5' 9 (1.753 m)  Weight: 219 lb (99.3 kg)  SpO2: 97%  BMI (Calculated): 32.33    Physical Exam Vitals and nursing note reviewed.  Constitutional:      Appearance: Normal appearance. She is normal weight.  HENT:     Head: Normocephalic and atraumatic.     Nose: Nose normal.     Mouth/Throat:     Mouth: Mucous membranes are moist.  Eyes:     Extraocular Movements: Extraocular movements intact.     Conjunctiva/sclera: Conjunctivae normal.     Pupils: Pupils are equal, round, and reactive to light.  Cardiovascular:     Rate and Rhythm: Normal rate and regular rhythm.     Pulses: Normal pulses.     Heart sounds: Normal heart sounds.  Pulmonary:     Effort: Pulmonary effort is normal.     Breath sounds: Normal breath sounds.  Abdominal:     General: Abdomen is flat. Bowel sounds are normal.     Palpations: Abdomen is soft.  Musculoskeletal:        General: Normal range of motion.     Cervical back: Normal range of  motion.  Skin:    General: Skin is warm and dry.  Neurological:     General: No focal deficit present.     Mental Status: She is alert and oriented to person, place, and time.  Psychiatric:        Mood and Affect: Mood normal.        Behavior: Behavior normal.        Thought Content: Thought content normal.        Judgment: Judgment normal.      Results for orders placed or performed in visit on 06/03/24  POCT Urinalysis Dipstick (18997)  Result  Value Ref Range   Color, UA     Clarity, UA     Glucose, UA Negative Negative   Bilirubin, UA Negative    Ketones, UA Negative    Spec Grav, UA 1.010 1.010 - 1.025   Blood, UA Negative    pH, UA 7.0 5.0 - 8.0   Protein, UA Negative Negative   Urobilinogen, UA 0.2 0.2 or 1.0 E.U./dL   Nitrite, UA Negative    Leukocytes, UA Negative Negative   Appearance     Odor      Recent Results (from the past 2160 hours)  Urinalysis, w/ Reflex to Culture (Infection Suspected) -Urine, Clean Catch     Status: Abnormal   Collection Time: 04/21/24  6:47 PM  Result Value Ref Range   Specimen Source URINE, CLEAN CATCH    Color, Urine YELLOW YELLOW   APPearance CLEAR CLEAR   Specific Gravity, Urine 1.010 1.005 - 1.030   pH 5.5 5.0 - 8.0   Glucose, UA NEGATIVE NEGATIVE mg/dL   Hgb urine dipstick NEGATIVE NEGATIVE   Bilirubin Urine NEGATIVE NEGATIVE   Ketones, ur NEGATIVE NEGATIVE mg/dL   Protein, ur NEGATIVE NEGATIVE mg/dL   Nitrite NEGATIVE NEGATIVE   Leukocytes,Ua NEGATIVE NEGATIVE   Squamous Epithelial / HPF 6-10 0 - 5 /HPF   WBC, UA 0-5 0 - 5 WBC/hpf    Comment: Reflex urine culture not performed if WBC <=10, OR if Squamous epithelial cells >5. If Squamous epithelial cells >5, suggest recollection.   RBC / HPF 0-5 0 - 5 RBC/hpf   Bacteria, UA FEW (A) NONE SEEN    Comment: Performed at [facility], [address], [place], [place] [address]  Urine Culture     Status: None   Collection Time: 04/21/24  6:47 PM   Specimen:  Urine, Clean Catch  Result Value Ref Range   Specimen Description      URINE, CLEAN CATCH Performed at [facility], [address], [place], [place] [address]    Special Requests      NONE Performed at [facility], [address] [place], [place] [address]    Culture      NO GROWTH Performed at [facility], [address] [place], [place] [address]    Report Status 04/23/2024 FINAL   Glucose, capillary     Status: Abnormal   Collection Time: 04/21/24  7:18 PM  Result Value Ref Range   Glucose-Capillary 133 (H) 70 - 99 mg/dL    Comment: Glucose reference range applies only to samples taken after fasting for at least 8 hours.  Cervicovaginal ancillary only     Status: Abnormal   Collection Time: 04/21/24  7:20 PM  Result Value Ref Range   Neisseria Gonorrhea Negative    Chlamydia Negative    Trichomonas Negative    Bacterial Vaginitis (gardnerella) Positive (A)    Candida Vaginitis Positive (A)    Candida Glabrata Negative    Comment Normal Reference Ranger Chlamydia - Negative    Comment      Normal Reference Range Neisseria Gonorrhea - Negative   Comment      Normal Reference Range Bacterial Vaginosis - Negative   Comment Normal Reference Range Candida Species - Negative    Comment Normal Reference Range Candida Galbrata - Negative    Comment Normal Reference Range Trichomonas - Negative   POCT Urinalysis Dipstick     Status: Abnormal   Collection Time: 04/30/24  3:54 PM  Result Value Ref Range   Color, UA orange    Clarity, UA cloudy    Glucose, UA Negative Negative   Bilirubin, UA negative    Ketones, UA negative    Spec Grav, UA 1.020 1.010 - 1.025   Blood, UA positive    pH, UA 6.0 5.0 - 8.0   Protein, UA Negative Negative   Urobilinogen, UA 0.2 0.2 or 1.0 E.U./dL   Nitrite, UA negative    Leukocytes, UA Trace (A) Negative   Appearance cloudy    Odor yes   Urinalysis, Routine w reflex microscopic     Status: Abnormal    Collection Time: 04/30/24  4:00 PM  Result Value Ref Range   Specific Gravity, UA 1.015 1.005 - 1.030   pH, UA 6.0 5.0 - 7.5   Color, UA Yellow Yellow   Appearance Ur Cloudy (A) Clear   Leukocytes,UA 1+ (A) Negative   Protein,UA Negative Negative/Trace   Glucose, UA Negative Negative   Ketones, UA Negative Negative   RBC, UA Trace (A) Negative   Bilirubin, UA Negative Negative   Urobilinogen, Ur 0.2 0.2 - 1.0 mg/dL   Nitrite, UA Positive (A) Negative   Microscopic Examination See below:     Comment: Microscopic was indicated and was performed.  Urine Culture     Status: Abnormal   Collection Time: 04/30/24  4:00 PM   Specimen: Urine, Clean Catch   UR  Result Value Ref Range   Urine Culture, Routine Final report (A)    Organism ID, Bacteria Escherichia coli (A)     Comment: Cefazolin with an MIC <=16 predicts susceptibility to the oral agents cefaclor, cefdinir, cefpodoxime, cefprozil, cefuroxime, cephalexin, and loracarbef when used for therapy of uncomplicated urinary tract infections due to E. coli, Klebsiella pneumoniae, and Proteus mirabilis. Greater than 100,000 colony forming units per mL    Antimicrobial Susceptibility Comment     Comment:       ** S = Susceptible; I = Intermediate; R = Resistant **                    P = Positive; N = Negative             MICS are expressed in micrograms per mL    Antibiotic                 RSLT#1    RSLT#2    RSLT#3    RSLT#4 Amoxicillin/Clavulanic Acid    S Ampicillin                     S Cefazolin                      S Cefepime                       S Cefoxitin                      S Cefpodoxime                    S Ceftriaxone                    S Ciprofloxacin                  S Ertapenem  S Gentamicin                     S Levofloxacin                   S Meropenem                      S Nitrofurantoin                  I Piperacillin/Tazobactam        S Tetracycline                   S Tobramycin                      S Trimethoprim /Sulfa              S   Microscopic Examination     Status: Abnormal   Collection Time: 04/30/24  4:00 PM  Result Value Ref Range   WBC, UA >30 (A) 0 - 5 /hpf   RBC, Urine 3-10 (A) 0 - 2 /hpf   Epithelial Cells (non renal) 0-10 0 - 10 /hpf   Casts None seen None seen /lpf   Bacteria, UA Many (A) None seen/Few  POCT Urinalysis Dipstick (18997)     Status: Normal   Collection Time: 06/03/24  1:21 PM  Result Value Ref Range   Color, UA     Clarity, UA     Glucose, UA Negative Negative   Bilirubin, UA Negative    Ketones, UA Negative    Spec Grav, UA 1.010 1.010 - 1.025   Blood, UA Negative    pH, UA 7.0 5.0 - 8.0   Protein, UA Negative Negative   Urobilinogen, UA 0.2 0.2 or 1.0 E.U./dL   Nitrite, UA Negative    Leukocytes, UA Negative Negative   Appearance     Odor        Assessment & Plan:  Full back xray Baclofen   Problem List Items Addressed This Visit       Other   Acute back pain - Primary   Relevant Medications   baclofen  (LIORESAL ) 10 MG tablet   Other Relevant Orders   DG Cervical Spine Complete   DG Lumbar Spine 2-3 Views   DG Thoracic Spine 2 View   Finger numbness   Relevant Orders   DG Cervical Spine Complete   DG Thoracic Spine 2 View   Other Visit Diagnoses       Flank pain       Relevant Orders   POCT Urinalysis Dipstick (18997) (Completed)       Return in about 3 weeks (around 06/24/2024) for with amanda.   Total time spent: 25 minutes  [employer], NP  06/03/2024   This document may have been prepared by Dragon Voice Recognition software and as such may include unintentional dictation errors.

## 2024-06-13 ENCOUNTER — Other Ambulatory Visit: Payer: Self-pay | Admitting: Cardiology

## 2024-06-13 DIAGNOSIS — M549 Dorsalgia, unspecified: Secondary | ICD-10-CM

## 2024-06-13 DIAGNOSIS — M5416 Radiculopathy, lumbar region: Secondary | ICD-10-CM

## 2024-06-16 ENCOUNTER — Other Ambulatory Visit: Payer: Self-pay | Admitting: Cardiology

## 2024-06-16 MED ORDER — MELOXICAM 7.5 MG PO TABS: 7.5000 mg | ORAL_TABLET | Freq: Every day | ORAL | 2 refills | Status: DC

## 2024-06-24 ENCOUNTER — Ambulatory Visit: Admitting: Family

## 2024-06-25 ENCOUNTER — Other Ambulatory Visit: Payer: Self-pay | Admitting: Cardiology

## 2024-06-29 ENCOUNTER — Other Ambulatory Visit: Payer: Self-pay | Admitting: Family

## 2024-07-07 ENCOUNTER — Encounter: Payer: Self-pay | Admitting: Family

## 2024-08-06 ENCOUNTER — Ambulatory Visit: Admitting: Family

## 2024-08-08 ENCOUNTER — Ambulatory Visit: Admitting: Family

## 2024-08-08 ENCOUNTER — Encounter: Payer: Self-pay | Admitting: Family

## 2024-08-08 VITALS — BP 130/88 | HR 78 | Ht 69.0 in | Wt 220.2 lb

## 2024-08-08 DIAGNOSIS — R5382 Chronic fatigue, unspecified: Secondary | ICD-10-CM

## 2024-08-08 DIAGNOSIS — E039 Hypothyroidism, unspecified: Secondary | ICD-10-CM

## 2024-08-08 DIAGNOSIS — Z789 Other specified health status: Secondary | ICD-10-CM | POA: Insufficient documentation

## 2024-08-08 DIAGNOSIS — I152 Hypertension secondary to endocrine disorders: Secondary | ICD-10-CM

## 2024-08-08 DIAGNOSIS — E559 Vitamin D deficiency, unspecified: Secondary | ICD-10-CM | POA: Insufficient documentation

## 2024-08-08 DIAGNOSIS — E782 Mixed hyperlipidemia: Secondary | ICD-10-CM

## 2024-08-08 DIAGNOSIS — R7689 Other specified abnormal immunological findings in serum: Secondary | ICD-10-CM

## 2024-08-08 DIAGNOSIS — E538 Deficiency of other specified B group vitamins: Secondary | ICD-10-CM | POA: Insufficient documentation

## 2024-08-08 DIAGNOSIS — E1159 Type 2 diabetes mellitus with other circulatory complications: Secondary | ICD-10-CM | POA: Insufficient documentation

## 2024-08-08 DIAGNOSIS — M797 Fibromyalgia: Secondary | ICD-10-CM

## 2024-08-08 DIAGNOSIS — Z79899 Other long term (current) drug therapy: Secondary | ICD-10-CM | POA: Insufficient documentation

## 2024-08-08 DIAGNOSIS — Z8261 Family history of arthritis: Secondary | ICD-10-CM | POA: Insufficient documentation

## 2024-08-08 DIAGNOSIS — G4733 Obstructive sleep apnea (adult) (pediatric): Secondary | ICD-10-CM | POA: Insufficient documentation

## 2024-08-08 DIAGNOSIS — E1169 Type 2 diabetes mellitus with other specified complication: Secondary | ICD-10-CM | POA: Insufficient documentation

## 2024-08-08 DIAGNOSIS — E1165 Type 2 diabetes mellitus with hyperglycemia: Secondary | ICD-10-CM | POA: Diagnosis not present

## 2024-08-08 LAB — HEMOGLOBIN A1C: Hemoglobin A1C: 6.9

## 2024-08-08 LAB — PROTEIN / CREATININE RATIO, URINE
Albumin, U: 8
Creatinine, Urine: 124.2

## 2024-08-08 LAB — MICROALBUMIN / CREATININE URINE RATIO: Microalb Creat Ratio: 6.4

## 2024-08-08 MED ORDER — SEMAGLUTIDE (1 MG/DOSE) 4 MG/3ML ~~LOC~~ SOPN: 1.0000 mg | PEN_INJECTOR | SUBCUTANEOUS | 1 refills | Status: DC

## 2024-08-08 MED ORDER — MELOXICAM 15 MG PO TABS
7.5000 mg | ORAL_TABLET | Freq: Every day | ORAL | 3 refills | Status: AC
Start: 2024-08-08 — End: 2025-08-08

## 2024-08-08 NOTE — Assessment & Plan Note (Signed)
-   Increase Meloxicam  to 15 mg daily. - Check labs today and send to rheumatology as needed based off results. - Continue medications as prescribed.

## 2024-08-08 NOTE — Assessment & Plan Note (Signed)
-   ENT referral sent for inspire implant.

## 2024-08-08 NOTE — Progress Notes (Unsigned)
 Established Patient Office Visit  Subjective:  Patient ID: Kiara Woodard, female    DOB: 03-Jan-1976  Age: 48 y.o. MRN: [id number]  Chief Complaint  Patient presents with   Follow-up    3 month follow up    Patient is here today for her 1 month follow up.  She has been feeling fairly well since last appointment.   She does have additional concerns to discuss today. Patient reports she has sleep apnea and has CPAP machine. She reports after she puts on the machine her lower jaw does not stay in position and she looses seal. Reports daytime sleepiness and gasping multiple times a night with frequent waking's. Has looked into inspire in the past and would like to do that again.  She reports endocrinologist took patient off BP medication and reports she was not spilling protein in the urine and told her she could remain off of it. Her endocrinologist has patient on Ozempic  0.5 mg weekly injection. Patient reports her blood glucose levels on average are 170s. Will order Ozempic  1 mg weekly injections.   Labs are due today.  She needs refills.   She does not want a flu shot at this time.  I have reviewed her active problem list, medication list, allergies, family history, social history, health maintenance, notes from last encounter, lab results for her appointment today.      No other concerns at this time.   Past Medical History:  Diagnosis Date   Chronic fatigue 02/18/2020   Fibromyalgia 02/18/2020   Hypertension    Hypothyroidism (acquired) 06/26/2019   Lumbar radiculopathy 02/12/2014   Neoplasm of uncertain behavior of parotid gland    Patellar subluxation, right, initial encounter 05/22/2023   Type 2 diabetes mellitus without complication, without long-term current use of insulin  (HCC) 06/26/2019    Past Surgical History:  Procedure Laterality Date   CONTINUOUS NERVE MONITORING Left 08/22/2023   Procedure: CONTINUOUS NERVE MONITORING;  Surgeon: Blair Mt, MD;   Location: [facility];  Service: ENT;  Laterality: Left;   PAROTIDECTOMY Left 08/22/2023   Procedure: SUPERFICIAL PAROTIDECTOMY;  Surgeon: Blair Mt, MD;  Location: [facility];  Service: ENT;  Laterality: Left;    Social History   Socioeconomic History   Marital status: Married    Spouse name: Not on file   Number of children: Not on file   Years of education: Not on file   Highest education level: Not on file  Occupational History   Not on file  Tobacco Use   Smoking status: Former    Types: Cigarettes   Smokeless tobacco: Never  Vaping Use   Vaping status: Never Used  Substance and Sexual Activity   Alcohol use: Not Currently   Drug use: Never   Sexual activity: Yes  Other Topics Concern   Not on file  Social History Narrative   Lives at home with daughter.    Social Drivers of Corporate Investment Banker Strain: Not on file  Food Insecurity: Not on file  Transportation Needs: Not on file  Physical Activity: Not on file  Stress: Not on file  Social Connections: Not on file  Intimate Partner Violence: Not on file    Family History  Problem Relation Age of Onset   Breast cancer Maternal Aunt        mat great aunt    Allergies  Allergen Reactions   Penicillin G Itching    Fever, aches, itching    Review of Systems  Constitutional:  Positive for malaise/fatigue.  HENT: Negative.    Eyes:  Negative for blurred vision and pain.  Respiratory:  Negative for cough and shortness of breath.   Cardiovascular:  Negative for chest pain, palpitations, claudication and leg swelling.  Gastrointestinal:  Negative for abdominal pain, blood in stool, constipation, diarrhea, nausea and vomiting.  Genitourinary:  Negative for dysuria, frequency and urgency.  Musculoskeletal:  Positive for back pain and myalgias.  Skin: Negative.   Neurological:  Negative for dizziness, tingling, sensory change and headaches.  Endo/Heme/Allergies: Negative.   Psychiatric/Behavioral:  Negative.         Objective:   BP 130/88   Pulse 78   Ht 5' 9 (1.753 m)   Wt 220 lb 3.2 oz (99.9 kg)   SpO2 95%   BMI 32.52 kg/m   Vitals:   08/08/24 1502  BP: 130/88  Pulse: 78  Height: 5' 9 (1.753 m)  Weight: 220 lb 3.2 oz (99.9 kg)  SpO2: 95%  BMI (Calculated): 32.5    Physical Exam Vitals and nursing note reviewed.  Constitutional:      Appearance: Normal appearance.  HENT:     Head: Normocephalic.  Eyes:     Extraocular Movements: Extraocular movements intact.     Pupils: Pupils are equal, round, and reactive to light.  Cardiovascular:     Rate and Rhythm: Normal rate and regular rhythm.     Pulses: Normal pulses.     Heart sounds: Normal heart sounds. No murmur heard. Pulmonary:     Effort: Pulmonary effort is normal. No respiratory distress.     Breath sounds: Normal breath sounds.  Abdominal:     General: There is no distension.     Tenderness: There is no abdominal tenderness.  Musculoskeletal:        General: No tenderness. Normal range of motion.     Cervical back: Normal range of motion and neck supple.     Right lower leg: No edema.     Left lower leg: No edema.  Skin:    General: Skin is warm and dry.     Coloration: Skin is not jaundiced.     Findings: No erythema.  Neurological:     General: No focal deficit present.     Mental Status: She is alert and oriented to person, place, and time.  Psychiatric:        Mood and Affect: Mood normal.        Speech: Speech normal.        Behavior: Behavior is cooperative.        Cognition and Memory: Memory is not impaired.      No results found for any visits on 08/08/24.  Recent Results (from the past 2160 hours)  POCT Urinalysis Dipstick (18997)     Status: Normal   Collection Time: 06/03/24  1:21 PM  Result Value Ref Range   Color, UA     Clarity, UA     Glucose, UA Negative Negative   Bilirubin, UA Negative    Ketones, UA Negative    Spec Grav, UA 1.010 1.010 - 1.025   Blood, UA  Negative    pH, UA 7.0 5.0 - 8.0   Protein, UA Negative Negative   Urobilinogen, UA 0.2 0.2 or 1.0 E.U./dL   Nitrite, UA Negative    Leukocytes, UA Negative Negative   Appearance     Odor         Assessment & Plan:   Assessment &  Plan Family hx-arthritis Hypothyroidism (acquired) Positive ANA (antinuclear antibody) Fibromyalgia Chronic fatigue - Increase Meloxicam  to 15 mg daily. - Check labs today and send to rheumatology as needed based off results. - Continue medications as prescribed. OSA (obstructive sleep apnea) Intolerance of continuous positive airway pressure (CPAP) ventilation - ENT referral sent for inspire implant. Hypertension associated with diabetes (HCC) Type 2 diabetes mellitus with hyperglycemia, without long-term current use of insulin  (HCC) Combined hyperlipidemia associated with type 2 diabetes mellitus (HCC) - Continue healthy diet and exercise as tolerated. - Continue medications as prescribed. - Check labs today  - Start Ozmepic 1 mg weekly injection B12 deficiency due to diet Vitamin D  deficiency, unspecified - Check labs today - Supplementation recommended based off lab results and will notify patient at that time  Long term current use of therapeutic drug - Tox screen ordered.    Return in about 3 months (around 11/08/2024).   Total time spent: 25 minutes  Oddis DELENA Cain, FNP  08/08/2024   This document may have been prepared by [employer] Voice Recognition software and as such may include unintentional dictation errors.

## 2024-08-08 NOTE — Assessment & Plan Note (Signed)
-   Continue healthy diet and exercise as tolerated. - Continue medications as prescribed. - Check labs today  - Start Ozmepic 1 mg weekly injection

## 2024-08-08 NOTE — Assessment & Plan Note (Signed)
-   Check labs today - Supplementation recommended based off lab results and will notify patient at that time

## 2024-08-08 NOTE — Assessment & Plan Note (Signed)
Tox screen ordered

## 2024-08-13 ENCOUNTER — Other Ambulatory Visit

## 2024-08-14 LAB — TOXASSURE SELECT 13 (MW), URINE

## 2024-08-17 LAB — CBC WITH DIFFERENTIAL/PLATELET
Basophils Absolute: 0.1 x10E3/uL (ref 0.0–0.2)
Basos: 1 %
EOS (ABSOLUTE): 0.2 x10E3/uL (ref 0.0–0.4)
Eos: 2 %
Hematocrit: 39.4 % (ref 34.0–46.6)
Hemoglobin: 12.8 g/dL (ref 11.1–15.9)
Immature Grans (Abs): 0 x10E3/uL (ref 0.0–0.1)
Immature Granulocytes: 0 %
Lymphocytes Absolute: 2.5 x10E3/uL (ref 0.7–3.1)
Lymphs: 27 %
MCH: 29.5 pg (ref 26.6–33.0)
MCHC: 32.5 g/dL (ref 31.5–35.7)
MCV: 91 fL (ref 79–97)
Monocytes Absolute: 0.7 x10E3/uL (ref 0.1–0.9)
Monocytes: 8 %
Neutrophils Absolute: 5.6 x10E3/uL (ref 1.4–7.0)
Neutrophils: 62 %
Platelets: 362 x10E3/uL (ref 150–450)
RBC: 4.34 x10E6/uL (ref 3.77–5.28)
RDW: 13.2 % (ref 11.7–15.4)
WBC: 9 x10E3/uL (ref 3.4–10.8)

## 2024-08-17 LAB — CMP14+EGFR
ALT: 24 IU/L (ref 0–32)
AST: 25 IU/L (ref 0–40)
Albumin: 4.3 g/dL (ref 3.9–4.9)
Alkaline Phosphatase: 106 IU/L (ref 41–116)
BUN/Creatinine Ratio: 15 (ref 9–23)
BUN: 10 mg/dL (ref 6–24)
Bilirubin Total: 0.4 mg/dL (ref 0.0–1.2)
CO2: 25 mmol/L (ref 20–29)
Calcium: 9 mg/dL (ref 8.7–10.2)
Chloride: 104 mmol/L (ref 96–106)
Creatinine, Ser: 0.67 mg/dL (ref 0.57–1.00)
Globulin, Total: 2.5 g/dL (ref 1.5–4.5)
Glucose: 86 mg/dL (ref 70–99)
Potassium: 3.9 mmol/L (ref 3.5–5.2)
Sodium: 141 mmol/L (ref 134–144)
Total Protein: 6.8 g/dL (ref 6.0–8.5)
eGFR: 108 mL/min/1.73 (ref >59)

## 2024-08-17 LAB — RHEUMATOID ARTHRITIS PROFILE
Cyclic Citrullin Peptide Ab: 10 U (ref 0–19)
Rheumatoid fact SerPl-aCnc: <10 [IU]/mL (ref <14.0)

## 2024-08-17 LAB — LIPID PANEL
Chol/HDL Ratio: 3.5 ratio (ref 0.0–4.4)
Cholesterol, Total: 177 mg/dL (ref 100–199)
HDL: 51 mg/dL (ref >39)
LDL Chol Calc (NIH): 106 mg/dL — ABNORMAL HIGH (ref 0–99)
Triglycerides: 111 mg/dL (ref 0–149)
VLDL Cholesterol Cal: 20 mg/dL (ref 5–40)

## 2024-08-17 LAB — VITAMIN D 25 HYDROXY (VIT D DEFICIENCY, FRACTURES): Vit D, 25-Hydroxy: 32.4 ng/mL (ref 30.0–100.0)

## 2024-08-17 LAB — VITAMIN B12: Vitamin B-12: 636 pg/mL (ref 232–1245)

## 2024-09-02 ENCOUNTER — Ambulatory Visit: Payer: Self-pay

## 2024-09-05 ENCOUNTER — Other Ambulatory Visit: Payer: Self-pay | Admitting: Family

## 2024-09-05 MED ORDER — PREGABALIN 150 MG PO CAPS: 150.0000 mg | ORAL_CAPSULE | Freq: Two times a day (BID) | ORAL | 1 refills | Status: AC

## 2024-09-20 ENCOUNTER — Other Ambulatory Visit: Payer: Self-pay | Admitting: Cardiology

## 2024-09-23 ENCOUNTER — Other Ambulatory Visit: Payer: Self-pay | Admitting: Family

## 2024-09-30 ENCOUNTER — Other Ambulatory Visit: Payer: Self-pay | Admitting: Family

## 2024-09-30 DIAGNOSIS — E1165 Type 2 diabetes mellitus with hyperglycemia: Secondary | ICD-10-CM

## 2024-11-10 ENCOUNTER — Ambulatory Visit: Admitting: Family
